# Patient Record
Sex: Female | Born: 1940
Health system: Southern US, Community
[De-identification: ages and names within clinical notes are randomized; demographics above are authoritative.]

## PROBLEM LIST (undated history)

## (undated) DIAGNOSIS — K219 Gastro-esophageal reflux disease without esophagitis: Secondary | ICD-10-CM

## (undated) DIAGNOSIS — I10 Essential (primary) hypertension: Secondary | ICD-10-CM

## (undated) DIAGNOSIS — M1990 Unspecified osteoarthritis, unspecified site: Secondary | ICD-10-CM

## (undated) DIAGNOSIS — Z85828 Personal history of other malignant neoplasm of skin: Secondary | ICD-10-CM

## (undated) DIAGNOSIS — G2581 Restless legs syndrome: Secondary | ICD-10-CM

## (undated) HISTORY — DX: Essential (primary) hypertension: I10

## (undated) HISTORY — DX: Gastro-esophageal reflux disease without esophagitis: K21.9

## (undated) HISTORY — PX: BREAST CYST EXCISION: SHX579

---

## 2000-02-15 HISTORY — PX: CHOLECYSTECTOMY: SHX55

## 2011-02-15 HISTORY — PX: DILATION AND CURETTAGE OF UTERUS: SHX78

## 2011-04-13 ENCOUNTER — Encounter: Payer: Self-pay | Admitting: Internal Medicine

## 2011-04-21 ENCOUNTER — Encounter: Payer: Self-pay | Admitting: Internal Medicine

## 2011-05-12 DIAGNOSIS — M25569 Pain in unspecified knee: Secondary | ICD-10-CM | POA: Diagnosis not present

## 2011-05-16 DIAGNOSIS — M25569 Pain in unspecified knee: Secondary | ICD-10-CM | POA: Diagnosis not present

## 2011-05-20 ENCOUNTER — Ambulatory Visit (AMBULATORY_SURGERY_CENTER): Payer: Medicare Other | Admitting: *Deleted

## 2011-05-20 VITALS — Ht 66.0 in | Wt 225.0 lb

## 2011-05-20 DIAGNOSIS — Z1211 Encounter for screening for malignant neoplasm of colon: Secondary | ICD-10-CM

## 2011-05-20 MED ORDER — PEG-KCL-NACL-NASULF-NA ASC-C 100 G PO SOLR: ORAL | Status: DC

## 2011-05-24 DIAGNOSIS — I10 Essential (primary) hypertension: Secondary | ICD-10-CM | POA: Diagnosis not present

## 2011-05-24 DIAGNOSIS — M25569 Pain in unspecified knee: Secondary | ICD-10-CM | POA: Diagnosis not present

## 2011-05-24 DIAGNOSIS — N390 Urinary tract infection, site not specified: Secondary | ICD-10-CM | POA: Diagnosis not present

## 2011-06-03 ENCOUNTER — Encounter: Payer: Self-pay | Admitting: Internal Medicine

## 2011-06-03 ENCOUNTER — Ambulatory Visit (AMBULATORY_SURGERY_CENTER): Payer: Medicare Other | Admitting: Internal Medicine

## 2011-06-03 VITALS — BP 143/74 | HR 68 | Temp 97.1°F | Resp 20 | Ht 66.0 in | Wt 225.0 lb

## 2011-06-03 DIAGNOSIS — D126 Benign neoplasm of colon, unspecified: Secondary | ICD-10-CM

## 2011-06-03 DIAGNOSIS — Z1211 Encounter for screening for malignant neoplasm of colon: Secondary | ICD-10-CM | POA: Diagnosis not present

## 2011-06-03 MED ORDER — SODIUM CHLORIDE 0.9 % IV SOLN: 500.0000 mL | INTRAVENOUS | Status: DC

## 2011-06-03 NOTE — Progress Notes (Signed)
Patient did not experience any of the following events: a burn prior to discharge; a fall within the facility; wrong site/side/patient/procedure/implant event; or a hospital transfer or hospital admission upon discharge from the facility. (G8907) Patient did not have preoperative order for IV antibiotic SSI prophylaxis. (G8918)  

## 2011-06-03 NOTE — Patient Instructions (Signed)

## 2011-06-03 NOTE — Op Note (Signed)
[facility] [address] [place], [place]  [address]  COLONOSCOPY PROCEDURE REPORT  PATIENT:  Latasha Williams, Latasha Williams  MR#:  [id number] BIRTHDATE:  Jan 24, 1941, 71 yrs. old  GENDER:  female ENDOSCOPIST:  Hedwig Morton. Juanda Chance, MD REF. BY:  Renae Fickle, M.D. PROCEDURE DATE:  06/03/2011 PROCEDURE:  Colonoscopy with biopsy ASA CLASS:  Class II INDICATIONS:  colorectal cancer screening, average risk last colon 2003 MEDICATIONS:   MAC sedation, administered by CRNA, propofol (Diprivan) 200 mg  DESCRIPTION OF PROCEDURE:   After the risks and benefits and of the procedure were explained, informed consent was obtained. Digital rectal exam was performed and revealed no rectal masses. The LB [id number] endoscope was introduced through the anus and advanced to the cecum, which was identified by both the appendix and ileocecal valve.  The quality of the prep was good, using MoviPrep.  The instrument was then slowly withdrawn as the colon was fully examined. <<PROCEDUREIMAGES>>  FINDINGS:  A diminutive polyp was found. 3 mm polyp at 60 cm The polyp was removed using cold biopsy forceps (see image1 and image2).  Moderate diverticulosis was found in the sigmoid to descending colon segments (see image6 and image7).  This was otherwise a normal examination of the colon (see image8, image5, image4, and image3).   Retroflexed views in the rectum revealed no abnormalities.    The scope was then withdrawn from the patient and the procedure completed.  COMPLICATIONS:  None ENDOSCOPIC IMPRESSION: 1) Diminutive polyp 2) Moderate diverticulosis in the sigmoid to descending colon segments 3) Otherwise normal examination RECOMMENDATIONS: 1) Await pathology results 2) High fiber diet.  REPEAT EXAM:  In 10 year(s) for.  recall depending on pt's health in 10 years  ______________________________ Hedwig Morton. Juanda Chance, MD  CC:  n. eSIGNED:   Hedwig Morton. Damyn Weitzel at 06/03/2011 11:41 AM  Ladora Daniel,  [phone]

## 2011-06-06 ENCOUNTER — Telehealth: Payer: Self-pay | Admitting: *Deleted

## 2011-06-06 NOTE — Telephone Encounter (Signed)
  Follow up Call-  Call back number 06/03/2011  Post procedure Call Back phone  # [phone]  Permission to leave phone message Yes     Patient questions:  Do you have a fever, pain , or abdominal swelling? no Pain Score  0 *  Have you tolerated food without any problems? yes  Have you been able to return to your normal activities? yes  Do you have any questions about your discharge instructions: Diet   no Medications  no Follow up visit  no  Do you have questions or concerns about your Care? no  Actions: * If pain score is 4 or above: No action needed, pain <4.

## 2011-06-07 ENCOUNTER — Encounter: Payer: Self-pay | Admitting: Internal Medicine

## 2011-06-09 DIAGNOSIS — N390 Urinary tract infection, site not specified: Secondary | ICD-10-CM | POA: Diagnosis not present

## 2011-06-09 DIAGNOSIS — N760 Acute vaginitis: Secondary | ICD-10-CM | POA: Diagnosis not present

## 2011-06-24 DIAGNOSIS — M1710 Unilateral primary osteoarthritis, unspecified knee: Secondary | ICD-10-CM | POA: Diagnosis not present

## 2011-07-01 DIAGNOSIS — M1710 Unilateral primary osteoarthritis, unspecified knee: Secondary | ICD-10-CM | POA: Diagnosis not present

## 2011-07-01 DIAGNOSIS — M25469 Effusion, unspecified knee: Secondary | ICD-10-CM | POA: Diagnosis not present

## 2011-07-01 DIAGNOSIS — M25569 Pain in unspecified knee: Secondary | ICD-10-CM | POA: Diagnosis not present

## 2011-07-01 DIAGNOSIS — IMO0002 Reserved for concepts with insufficient information to code with codable children: Secondary | ICD-10-CM | POA: Diagnosis not present

## 2011-07-08 DIAGNOSIS — M25569 Pain in unspecified knee: Secondary | ICD-10-CM | POA: Diagnosis not present

## 2011-07-08 DIAGNOSIS — M1710 Unilateral primary osteoarthritis, unspecified knee: Secondary | ICD-10-CM | POA: Diagnosis not present

## 2011-07-28 DIAGNOSIS — M1710 Unilateral primary osteoarthritis, unspecified knee: Secondary | ICD-10-CM | POA: Diagnosis not present

## 2011-08-24 DIAGNOSIS — M1710 Unilateral primary osteoarthritis, unspecified knee: Secondary | ICD-10-CM | POA: Diagnosis not present

## 2011-09-01 DIAGNOSIS — M1710 Unilateral primary osteoarthritis, unspecified knee: Secondary | ICD-10-CM | POA: Diagnosis not present

## 2011-09-07 DIAGNOSIS — M1710 Unilateral primary osteoarthritis, unspecified knee: Secondary | ICD-10-CM | POA: Diagnosis not present

## 2011-09-08 DIAGNOSIS — N760 Acute vaginitis: Secondary | ICD-10-CM | POA: Diagnosis not present

## 2011-09-08 DIAGNOSIS — N950 Postmenopausal bleeding: Secondary | ICD-10-CM | POA: Diagnosis not present

## 2011-09-08 DIAGNOSIS — R300 Dysuria: Secondary | ICD-10-CM | POA: Diagnosis not present

## 2011-10-03 DIAGNOSIS — N950 Postmenopausal bleeding: Secondary | ICD-10-CM | POA: Diagnosis not present

## 2011-10-20 DIAGNOSIS — R269 Unspecified abnormalities of gait and mobility: Secondary | ICD-10-CM | POA: Diagnosis not present

## 2011-10-20 DIAGNOSIS — Z6837 Body mass index (BMI) 37.0-37.9, adult: Secondary | ICD-10-CM | POA: Diagnosis not present

## 2011-10-20 DIAGNOSIS — M25569 Pain in unspecified knee: Secondary | ICD-10-CM | POA: Diagnosis not present

## 2011-10-20 DIAGNOSIS — M21069 Valgus deformity, not elsewhere classified, unspecified knee: Secondary | ICD-10-CM | POA: Diagnosis not present

## 2011-10-20 DIAGNOSIS — I10 Essential (primary) hypertension: Secondary | ICD-10-CM | POA: Diagnosis not present

## 2011-10-20 DIAGNOSIS — Z01818 Encounter for other preprocedural examination: Secondary | ICD-10-CM | POA: Diagnosis not present

## 2011-10-20 DIAGNOSIS — M1710 Unilateral primary osteoarthritis, unspecified knee: Secondary | ICD-10-CM | POA: Diagnosis not present

## 2011-10-26 DIAGNOSIS — M1710 Unilateral primary osteoarthritis, unspecified knee: Secondary | ICD-10-CM | POA: Diagnosis not present

## 2011-11-25 DIAGNOSIS — Z23 Encounter for immunization: Secondary | ICD-10-CM | POA: Diagnosis not present

## 2011-12-12 DIAGNOSIS — N950 Postmenopausal bleeding: Secondary | ICD-10-CM | POA: Diagnosis not present

## 2011-12-20 DIAGNOSIS — N950 Postmenopausal bleeding: Secondary | ICD-10-CM | POA: Diagnosis not present

## 2011-12-23 DIAGNOSIS — I10 Essential (primary) hypertension: Secondary | ICD-10-CM | POA: Diagnosis not present

## 2011-12-23 DIAGNOSIS — K219 Gastro-esophageal reflux disease without esophagitis: Secondary | ICD-10-CM | POA: Diagnosis not present

## 2011-12-23 DIAGNOSIS — N950 Postmenopausal bleeding: Secondary | ICD-10-CM | POA: Diagnosis not present

## 2011-12-23 DIAGNOSIS — E669 Obesity, unspecified: Secondary | ICD-10-CM | POA: Diagnosis not present

## 2011-12-23 DIAGNOSIS — I495 Sick sinus syndrome: Secondary | ICD-10-CM | POA: Diagnosis not present

## 2011-12-23 DIAGNOSIS — Z79899 Other long term (current) drug therapy: Secondary | ICD-10-CM | POA: Diagnosis not present

## 2011-12-23 DIAGNOSIS — M899 Disorder of bone, unspecified: Secondary | ICD-10-CM | POA: Diagnosis not present

## 2012-01-05 DIAGNOSIS — N950 Postmenopausal bleeding: Secondary | ICD-10-CM | POA: Diagnosis not present

## 2012-01-06 DIAGNOSIS — Z1231 Encounter for screening mammogram for malignant neoplasm of breast: Secondary | ICD-10-CM | POA: Diagnosis not present

## 2012-01-11 DIAGNOSIS — M1710 Unilateral primary osteoarthritis, unspecified knee: Secondary | ICD-10-CM | POA: Diagnosis not present

## 2012-01-11 DIAGNOSIS — M21069 Valgus deformity, not elsewhere classified, unspecified knee: Secondary | ICD-10-CM | POA: Diagnosis not present

## 2012-01-11 DIAGNOSIS — M25569 Pain in unspecified knee: Secondary | ICD-10-CM | POA: Diagnosis not present

## 2012-01-11 DIAGNOSIS — R269 Unspecified abnormalities of gait and mobility: Secondary | ICD-10-CM | POA: Diagnosis not present

## 2012-02-23 DIAGNOSIS — M1710 Unilateral primary osteoarthritis, unspecified knee: Secondary | ICD-10-CM | POA: Diagnosis not present

## 2012-03-15 DIAGNOSIS — J0100 Acute maxillary sinusitis, unspecified: Secondary | ICD-10-CM | POA: Diagnosis not present

## 2012-04-09 DIAGNOSIS — Z01818 Encounter for other preprocedural examination: Secondary | ICD-10-CM | POA: Diagnosis not present

## 2012-04-09 DIAGNOSIS — Z6833 Body mass index (BMI) 33.0-33.9, adult: Secondary | ICD-10-CM | POA: Diagnosis not present

## 2012-04-09 DIAGNOSIS — I10 Essential (primary) hypertension: Secondary | ICD-10-CM | POA: Diagnosis not present

## 2012-04-09 DIAGNOSIS — M25569 Pain in unspecified knee: Secondary | ICD-10-CM | POA: Diagnosis not present

## 2012-05-08 ENCOUNTER — Encounter (HOSPITAL_COMMUNITY): Payer: Self-pay | Admitting: Pharmacy Technician

## 2012-05-10 ENCOUNTER — Other Ambulatory Visit: Payer: Self-pay | Admitting: Orthopedic Surgery

## 2012-05-10 MED ORDER — DEXAMETHASONE SODIUM PHOSPHATE 10 MG/ML IJ SOLN: 10.0000 mg | Freq: Once | INTRAMUSCULAR | Status: DC

## 2012-05-10 MED ORDER — BUPIVACAINE LIPOSOME 1.3 % IJ SUSP: 20.0000 mL | Freq: Once | INTRAMUSCULAR | Status: DC

## 2012-05-10 NOTE — Progress Notes (Signed)
Preoperative surgical orders have been place into the Epic hospital system for Latasha Williams on 05/10/2012, 11:01 AM  by Patrica Duel for surgery on 05/21/2012.  Preop Total Knee orders including Experal, IV Tylenol, and IV Decadron as long as there are no contraindications to the above medications. Avel Peace, PA-C

## 2012-05-11 ENCOUNTER — Other Ambulatory Visit (HOSPITAL_COMMUNITY): Payer: Self-pay | Admitting: Orthopedic Surgery

## 2012-05-14 ENCOUNTER — Inpatient Hospital Stay (HOSPITAL_COMMUNITY): Admission: RE | Admit: 2012-05-14 | Payer: Medicare Other | Source: Ambulatory Visit

## 2012-05-14 ENCOUNTER — Ambulatory Visit (HOSPITAL_COMMUNITY)
Admission: RE | Admit: 2012-05-14 | Discharge: 2012-05-14 | Disposition: A | Discharge status: Home / Self Care | Payer: Medicare Other | Source: Ambulatory Visit | Attending: Orthopedic Surgery | Admitting: Orthopedic Surgery

## 2012-05-14 ENCOUNTER — Encounter (HOSPITAL_COMMUNITY)
Admission: RE | Admit: 2012-05-14 | Discharge: 2012-05-14 | Disposition: A | Discharge status: Home / Self Care | Payer: Medicare Other | Source: Ambulatory Visit | Attending: Orthopedic Surgery | Admitting: Orthopedic Surgery

## 2012-05-14 ENCOUNTER — Encounter (HOSPITAL_COMMUNITY): Payer: Self-pay

## 2012-05-14 DIAGNOSIS — Z01818 Encounter for other preprocedural examination: Secondary | ICD-10-CM | POA: Insufficient documentation

## 2012-05-14 DIAGNOSIS — I10 Essential (primary) hypertension: Secondary | ICD-10-CM | POA: Insufficient documentation

## 2012-05-14 DIAGNOSIS — R82998 Other abnormal findings in urine: Secondary | ICD-10-CM | POA: Diagnosis not present

## 2012-05-14 DIAGNOSIS — Z01812 Encounter for preprocedural laboratory examination: Secondary | ICD-10-CM | POA: Diagnosis not present

## 2012-05-14 DIAGNOSIS — M1710 Unilateral primary osteoarthritis, unspecified knee: Secondary | ICD-10-CM | POA: Insufficient documentation

## 2012-05-14 HISTORY — DX: Restless legs syndrome: G25.81

## 2012-05-14 HISTORY — DX: Unspecified osteoarthritis, unspecified site: M19.90

## 2012-05-14 HISTORY — DX: Personal history of other malignant neoplasm of skin: Z85.828

## 2012-05-14 LAB — CBC
HCT: 43.1 % (ref 36.0–46.0)
Hemoglobin: 14.7 g/dL (ref 12.0–15.0)
MCHC: 34.1 g/dL (ref 30.0–36.0)
RBC: 4.65 MIL/uL (ref 3.87–5.11)

## 2012-05-14 LAB — COMPREHENSIVE METABOLIC PANEL
ALT: 17 U/L (ref 0–35)
Alkaline Phosphatase: 92 U/L (ref 39–117)
BUN: 15 mg/dL (ref 6–23)
CO2: 30 mEq/L (ref 19–32)
Chloride: 101 mEq/L (ref 96–112)
GFR calc Af Amer: 77 mL/min — ABNORMAL LOW (ref >90)
GFR calc non Af Amer: 67 mL/min — ABNORMAL LOW (ref >90)
Glucose, Bld: 99 mg/dL (ref 70–99)
Potassium: 4.3 mEq/L (ref 3.5–5.1)
Sodium: 139 mEq/L (ref 135–145)
Total Bilirubin: 0.3 mg/dL (ref 0.3–1.2)

## 2012-05-14 LAB — URINALYSIS, ROUTINE W REFLEX MICROSCOPIC
Bilirubin Urine: NEGATIVE
Glucose, UA: NEGATIVE mg/dL
Hgb urine dipstick: NEGATIVE
Specific Gravity, Urine: 1.015 (ref 1.005–1.030)
Urobilinogen, UA: 0.2 mg/dL (ref 0.0–1.0)
pH: 7 (ref 5.0–8.0)

## 2012-05-14 LAB — APTT: aPTT: 31 seconds (ref 24–37)

## 2012-05-14 LAB — URINE MICROSCOPIC-ADD ON

## 2012-05-14 LAB — PROTIME-INR: Prothrombin Time: 13.1 seconds (ref 11.6–15.2)

## 2012-05-14 NOTE — Patient Instructions (Signed)
Latasha Williams  05/14/2012                           YOUR PROCEDURE IS SCHEDULED ON: 05/21/12               PLEASE REPORT TO SHORT STAY CENTER AT : 7:45 AM               CALL THIS NUMBER IF ANY PROBLEMS THE DAY OF SURGERY :               [phone]                      REMEMBER:   Do not eat food or drink liquids AFTER MIDNIGHT   Take these medicines the morning of surgery with A SIP OF WATER: PRILOSEC   Do not wear jewelry, make-up   Do not wear lotions, powders, or perfumes.   Do not shave legs or underarms 12 hrs. before surgery (men may shave face)  Do not bring valuables to the hospital.  Contacts, dentures or bridgework may not be worn into surgery.  Leave suitcase in the car. After surgery it may be brought to your room.  For patients admitted to the hospital more than one night, checkout time is 11:00                          The day of discharge.   Patients discharged the day of surgery will not be allowed to drive home                             If going home same day of surgery, must have someone stay with you first                           24 hrs at home and arrange for some one to drive you home from hospital.    Special Instructions:   Please read over the following fact sheets that you were given:               1. MRSA  INFORMATION                      2. [place] PREPARING FOR SURGERY SHEET               3. INCENTIVE SPIROMETER                                                X_____________________________________________________________________        Failure to follow these instructions may result in cancellation of your surgery

## 2012-05-14 NOTE — Progress Notes (Signed)
Abnormal CBC /UA faxed to Dr. Lequita Halt thru [facility]

## 2012-05-15 NOTE — Progress Notes (Signed)
Received fax from office - pt told to take Cipro BID x 3 days (she has at home)

## 2012-05-17 LAB — URINE CULTURE

## 2012-05-20 ENCOUNTER — Other Ambulatory Visit: Payer: Self-pay | Admitting: Orthopedic Surgery

## 2012-05-20 NOTE — H&P (Signed)
Latasha Williams  DOB: [birth year] Married / Language: English / Race: White Female  Date of Admission:  05/21/2012  Chief Complaint:  Right Knee Pain  History of Present Illness The patient is a 72 year old female who comes in for a preoperative History and Physical. The patient is scheduled for a right total knee arthroplasty to be performed by Dr. Gus Rankin. Aluisio, MD at [facility] on 05/21/2012. The patient is a 72 year old female who presents with knee complaints. The patient is seen for a second opinion. The patient reports right knee symptoms including: pain, swelling and grinding which began 6 month(s) ago without any known injury. Prior to being seen today the patient was previously evaluated by a colleague. Previous work-up for this problem has included knee x-rays and knee MRI. Past treatment for this problem has included intra-articular injection of corticosteroids (and viscosupplementation). The patient was last seen in clinic 6 week(s) ago. Note for "Knee pain": Her last cortisone injection was the week of Thanksgiving. She brought xrays with her on a disc. She has been treated by Dr. Magdalene Patricia. Her problems initially began last spring. She doesn't recall specific injury, but did remember that her knee pain developed after digging a hole for a tree in her back yard. She has developed a lot of swelling in the right knee. The swelling has been persistent. She has had a series of Cortisone and Visco supplements in the summer and early fall which didn't help. She had another Cortisone injection about 6 weeks ago and has had some benefit from that. Unfortunately her knee is getting progressive worse. Her pain is worse lateral than medial. She is starting to develop instability in the knee. She cannot trust the knee. She feels as though it is definitely limiting what she can and cannot do and she would like to be more active. She is ready to proceed with surgery. They  have been treated conservatively in the past for the above stated problem and despite conservative measures, they continue to have progressive pain and severe functional limitations and dysfunction. They have failed non-operative management including home exercise, medications, and injections. It is felt that they would benefit from undergoing total joint replacement. Risks and benefits of the procedure have been discussed with the patient and they elect to proceed with surgery. There are no active contraindications to surgery such as ongoing infection or rapidly progressive neurological disease.   Problem List Primary osteoarthritis of one knee (715.16)   Allergies No Known Drug Allergies. 02/23/2012   Family History Cancer. father and sister Congestive Heart Failure. mother Heart Disease. mother Heart disease in female family member before age 56 Osteoarthritis. mother   Social History Exercise. Exercises daily; does running / walking Illicit drug use. no Living situation. live with spouse Current work status. retired Financial planner (Currently). no Drug/Alcohol Rehab (Previously). no Marital status. married Tobacco use. former smoker; smoke(d) less than 1/2 pack(s) per day Number of flights of stairs before winded. 2-3 Pain Contract. no Tobacco / smoke exposure. no Alcohol use. never consumed alcohol Children. 2   Medication History Atenolol-Chlorthalidone (50-25MG  Tablet, Oral) Active. PriLOSEC ( Oral) Specific dose unknown - Active. Biotin 5000 ( Oral) Specific dose unknown - Active. Fish Oil Burp-Less ( Oral) Specific dose unknown - Active. Multivitamins ( Oral) Active.   Past Surgical History Breast Mass; Local Excision. bilateral Dilation and Curettage of Uterus Gallbladder Surgery. laporoscopic   Medical History High blood pressure Chronic Cystitis Vitamin d  deficiency (268.9) Insomnia Osteoporosis   Review of  Systems General:Not Present- Chills, Fever, Night Sweats, Fatigue, Weight Gain, Weight Loss and Memory Loss. Skin:Not Present- Hives, Itching, Rash, Eczema and Lesions. HEENT:Not Present- Tinnitus, Headache, Double Vision, Visual Loss, Hearing Loss and Dentures. Respiratory:Not Present- Shortness of breath with exertion, Shortness of breath at rest, Allergies, Coughing up blood and Chronic Cough. Cardiovascular:Not Present- Chest Pain, Racing/skipping heartbeats, Difficulty Breathing Lying Down, Murmur, Swelling and Palpitations. Gastrointestinal:Not Present- Bloody Stool, Heartburn, Abdominal Pain, Vomiting, Nausea, Constipation, Diarrhea, Difficulty Swallowing, Jaundice and Loss of appetitie. Female Genitourinary:Not Present- Blood in Urine, Urinary frequency, Weak urinary stream, Discharge, Flank Pain, Incontinence, Painful Urination, Urgency, Urinary Retention and Urinating at Night. Musculoskeletal:Present- Joint Swelling. Not Present- Muscle Weakness, Muscle Pain, Joint Pain, Back Pain, Morning Stiffness and Spasms. Neurological:Not Present- Tremor, Dizziness, Blackout spells, Paralysis, Difficulty with balance and Weakness. Psychiatric:Not Present- Insomnia.   Vitals Weight: 198 lb Height: 66 in Body Surface Area: 2.05 m Body Mass Index: 31.96 kg/m Pulse: 64 (Regular) Resp.: 16 (Unlabored) BP: 122/64 (Sitting, Left Arm, Standard)    Physical Exam The physical exam findings are as follows:   General Mental Status - Alert, cooperative and good historian. General Appearance- pleasant. Not in acute distress. Orientation- Oriented X3. Build & Nutrition- Well nourished and Well developed.   Head and Neck Head- normocephalic, atraumatic . Neck Global Assessment- supple. no bruit auscultated on the right and no bruit auscultated on the left.   Eye Pupil- Bilateral- Regular and Round. Motion- Bilateral- EOMI.   Chest and Lung  Exam Auscultation: Breath sounds:- clear at anterior chest wall and - clear at posterior chest wall. Adventitious sounds:- No Adventitious sounds.   Cardiovascular Auscultation:Rhythm- Regular rate and rhythm. Heart Sounds- S1 WNL and S2 WNL. Murmurs & Other Heart Sounds:Auscultation of the heart reveals - No Murmurs.   Abdomen Palpation/Percussion:Tenderness- Abdomen is non-tender to palpation. Rigidity (guarding)- Abdomen is soft. Auscultation:Auscultation of the abdomen reveals - Bowel sounds normal.   Female Genitourinary Not done, not pertinent to present illness  Musculoskeletal Well developed female, alert and oriented in no apparent distress. Her hips show normal range of motion with no discomfort. Her left knee shows no effusion. Her range of motion on the left is 0 to 135. She does not have any joint line tenderness or instability. Her right knee shows a large effusion. She has a valgus deformity in that right knee. She has range of motion of about 5 to 125. She is tender, lateral greater than medial. There is no instability noted. Pulse, sensation, and motor are intact.   RADIOGRAPHS: September xrays show she had gone to bone on bone in the lateral compartment. Her patellofemoral compartment was also arthritic, but not bone on bone.  Assessment & Plan(DREW L Kabria Hetzer, PA-C; 04/27/2012 9:46 AM) Primary osteoarthritis of one knee (715.16) Impression: Right Knee  Note: Plan is for a Right Total Knee Replacement by Dr. Lequita Halt.  Plan is to go to [facility].  Dr. Renae Fickle - Patient has been seen preoperatively and felt to be stable for surgery.  Signed electronically by Roberts Gaudy, PA-C

## 2012-05-21 ENCOUNTER — Encounter (HOSPITAL_COMMUNITY)
Admission: RE | Disposition: A | Discharge status: Skilled Nursing Facility | Payer: Self-pay | Source: Ambulatory Visit | Attending: Orthopedic Surgery

## 2012-05-21 ENCOUNTER — Encounter (HOSPITAL_COMMUNITY): Payer: Self-pay | Admitting: Anesthesiology

## 2012-05-21 ENCOUNTER — Inpatient Hospital Stay (HOSPITAL_COMMUNITY)
Admission: RE | Admit: 2012-05-21 | Discharge: 2012-05-24 | DRG: 470 | Disposition: A | Discharge status: Skilled Nursing Facility | Payer: Medicare Other | Source: Ambulatory Visit | Attending: Orthopedic Surgery | Admitting: Orthopedic Surgery

## 2012-05-21 ENCOUNTER — Inpatient Hospital Stay (HOSPITAL_COMMUNITY): Payer: Medicare Other | Admitting: Anesthesiology

## 2012-05-21 ENCOUNTER — Encounter (HOSPITAL_COMMUNITY): Payer: Self-pay | Admitting: *Deleted

## 2012-05-21 DIAGNOSIS — E876 Hypokalemia: Secondary | ICD-10-CM | POA: Diagnosis not present

## 2012-05-21 DIAGNOSIS — E871 Hypo-osmolality and hyponatremia: Secondary | ICD-10-CM | POA: Diagnosis not present

## 2012-05-21 DIAGNOSIS — D62 Acute posthemorrhagic anemia: Secondary | ICD-10-CM | POA: Diagnosis not present

## 2012-05-21 DIAGNOSIS — I10 Essential (primary) hypertension: Secondary | ICD-10-CM | POA: Diagnosis not present

## 2012-05-21 DIAGNOSIS — D500 Iron deficiency anemia secondary to blood loss (chronic): Secondary | ICD-10-CM | POA: Diagnosis not present

## 2012-05-21 DIAGNOSIS — IMO0002 Reserved for concepts with insufficient information to code with codable children: Secondary | ICD-10-CM | POA: Diagnosis not present

## 2012-05-21 DIAGNOSIS — M1710 Unilateral primary osteoarthritis, unspecified knee: Principal | ICD-10-CM | POA: Diagnosis present

## 2012-05-21 DIAGNOSIS — G2581 Restless legs syndrome: Secondary | ICD-10-CM | POA: Diagnosis present

## 2012-05-21 DIAGNOSIS — K219 Gastro-esophageal reflux disease without esophagitis: Secondary | ICD-10-CM | POA: Diagnosis not present

## 2012-05-21 DIAGNOSIS — M25569 Pain in unspecified knee: Secondary | ICD-10-CM | POA: Diagnosis not present

## 2012-05-21 DIAGNOSIS — M84469D Pathological fracture, unspecified tibia and fibula, subsequent encounter for fracture with routine healing: Secondary | ICD-10-CM | POA: Diagnosis not present

## 2012-05-21 DIAGNOSIS — Z85828 Personal history of other malignant neoplasm of skin: Secondary | ICD-10-CM | POA: Diagnosis not present

## 2012-05-21 DIAGNOSIS — D638 Anemia in other chronic diseases classified elsewhere: Secondary | ICD-10-CM | POA: Diagnosis not present

## 2012-05-21 DIAGNOSIS — M179 Osteoarthritis of knee, unspecified: Secondary | ICD-10-CM | POA: Diagnosis present

## 2012-05-21 DIAGNOSIS — G8918 Other acute postprocedural pain: Secondary | ICD-10-CM | POA: Diagnosis not present

## 2012-05-21 DIAGNOSIS — Z96659 Presence of unspecified artificial knee joint: Secondary | ICD-10-CM | POA: Diagnosis not present

## 2012-05-21 DIAGNOSIS — M129 Arthropathy, unspecified: Secondary | ICD-10-CM | POA: Diagnosis not present

## 2012-05-21 DIAGNOSIS — Z96651 Presence of right artificial knee joint: Secondary | ICD-10-CM

## 2012-05-21 HISTORY — PX: TOTAL KNEE ARTHROPLASTY: SHX125

## 2012-05-21 LAB — TYPE AND SCREEN: Antibody Screen: NEGATIVE

## 2012-05-21 SURGERY — ARTHROPLASTY, KNEE, TOTAL
Anesthesia: Spinal | Site: Knee | Laterality: Right | Wound class: Clean

## 2012-05-21 MED ORDER — METHOCARBAMOL 500 MG PO TABS
500.0000 mg | ORAL_TABLET | Freq: Four times a day (QID) | ORAL | Status: DC | PRN
  Administered 2012-05-21 – 2012-05-24 (×9): 500 mg via ORAL
  Filled 2012-05-21 (×9): qty 1

## 2012-05-21 MED ORDER — RIVAROXABAN 10 MG PO TABS
10.0000 mg | ORAL_TABLET | Freq: Every day | ORAL | Status: DC
  Administered 2012-05-22 – 2012-05-24 (×3): 10 mg via ORAL
  Filled 2012-05-21 (×4): qty 1

## 2012-05-21 MED ORDER — ACETAMINOPHEN 325 MG PO TABS: 650.0000 mg | ORAL_TABLET | Freq: Four times a day (QID) | ORAL | Status: DC | PRN

## 2012-05-21 MED ORDER — POLYETHYLENE GLYCOL 3350 17 G PO PACK
17.0000 g | PACK | Freq: Every day | ORAL | Status: DC | PRN
  Administered 2012-05-23: 17 g via ORAL

## 2012-05-21 MED ORDER — DOCUSATE SODIUM 100 MG PO CAPS
100.0000 mg | ORAL_CAPSULE | Freq: Two times a day (BID) | ORAL | Status: DC
  Administered 2012-05-21 – 2012-05-24 (×6): 100 mg via ORAL

## 2012-05-21 MED ORDER — METHOCARBAMOL 100 MG/ML IJ SOLN: 500.0000 mg | Freq: Four times a day (QID) | INTRAVENOUS | Status: DC | PRN

## 2012-05-21 MED ORDER — DEXTROSE-NACL 5-0.9 % IV SOLN
INTRAVENOUS | Status: DC
  Administered 2012-05-21 – 2012-05-22 (×2): via INTRAVENOUS

## 2012-05-21 MED ORDER — CHLORTHALIDONE 25 MG PO TABS
12.5000 mg | ORAL_TABLET | Freq: Every day | ORAL | Status: DC
  Administered 2012-05-22 – 2012-05-24 (×3): 12.5 mg via ORAL
  Filled 2012-05-21 (×4): qty 0.5

## 2012-05-21 MED ORDER — SODIUM CHLORIDE 0.9 % IR SOLN
Status: DC | PRN
  Administered 2012-05-21: 1000 mL

## 2012-05-21 MED ORDER — ONDANSETRON HCL 4 MG/2ML IJ SOLN
4.0000 mg | Freq: Four times a day (QID) | INTRAMUSCULAR | Status: DC | PRN
  Administered 2012-05-21: 4 mg via INTRAVENOUS
  Filled 2012-05-21: qty 2

## 2012-05-21 MED ORDER — ATENOLOL 25 MG PO TABS
25.0000 mg | ORAL_TABLET | Freq: Every day | ORAL | Status: DC
  Administered 2012-05-22 – 2012-05-24 (×3): 25 mg via ORAL
  Filled 2012-05-21 (×4): qty 1

## 2012-05-21 MED ORDER — PROPOFOL INFUSION 10 MG/ML OPTIME
INTRAVENOUS | Status: DC | PRN
  Administered 2012-05-21: 75 ug/kg/min via INTRAVENOUS

## 2012-05-21 MED ORDER — BUPIVACAINE LIPOSOME 1.3 % IJ SUSP
20.0000 mL | Freq: Once | INTRAMUSCULAR | Status: DC
  Filled 2012-05-21: qty 20

## 2012-05-21 MED ORDER — ACETAMINOPHEN 10 MG/ML IV SOLN
1000.0000 mg | Freq: Four times a day (QID) | INTRAVENOUS | Status: AC
  Administered 2012-05-21 – 2012-05-22 (×4): 1000 mg via INTRAVENOUS
  Filled 2012-05-21 (×6): qty 100

## 2012-05-21 MED ORDER — PHENYLEPHRINE HCL 10 MG/ML IJ SOLN
INTRAMUSCULAR | Status: DC | PRN
  Administered 2012-05-21 (×2): 80 ug via INTRAVENOUS

## 2012-05-21 MED ORDER — BUPIVACAINE IN DEXTROSE 0.75-8.25 % IT SOLN
INTRATHECAL | Status: DC | PRN
  Administered 2012-05-21: 1.6 mL via INTRATHECAL

## 2012-05-21 MED ORDER — 0.9 % SODIUM CHLORIDE (POUR BTL) OPTIME
TOPICAL | Status: DC | PRN
  Administered 2012-05-21: 1000 mL

## 2012-05-21 MED ORDER — PANTOPRAZOLE SODIUM 40 MG PO TBEC
40.0000 mg | DELAYED_RELEASE_TABLET | Freq: Every day | ORAL | Status: DC
  Administered 2012-05-22: 40 mg via ORAL
  Filled 2012-05-21 (×3): qty 1

## 2012-05-21 MED ORDER — DIPHENHYDRAMINE HCL 12.5 MG/5ML PO ELIX: 12.5000 mg | ORAL_SOLUTION | ORAL | Status: DC | PRN

## 2012-05-21 MED ORDER — TRAMADOL HCL 50 MG PO TABS
50.0000 mg | ORAL_TABLET | Freq: Four times a day (QID) | ORAL | Status: DC | PRN
  Administered 2012-05-22 – 2012-05-24 (×3): 100 mg via ORAL
  Filled 2012-05-21 (×3): qty 2

## 2012-05-21 MED ORDER — ONDANSETRON HCL 4 MG PO TABS: 4.0000 mg | ORAL_TABLET | Freq: Four times a day (QID) | ORAL | Status: DC | PRN

## 2012-05-21 MED ORDER — ATENOLOL-CHLORTHALIDONE 50-25 MG PO TABS: 0.5000 | ORAL_TABLET | Freq: Every day | ORAL | Status: DC

## 2012-05-21 MED ORDER — BISACODYL 10 MG RE SUPP: 10.0000 mg | Freq: Every day | RECTAL | Status: DC | PRN

## 2012-05-21 MED ORDER — MORPHINE SULFATE 2 MG/ML IJ SOLN
1.0000 mg | INTRAMUSCULAR | Status: DC | PRN
  Administered 2012-05-21: 1 mg via INTRAVENOUS
  Filled 2012-05-21: qty 1

## 2012-05-21 MED ORDER — FLEET ENEMA 7-19 GM/118ML RE ENEM: 1.0000 | ENEMA | Freq: Once | RECTAL | Status: AC | PRN

## 2012-05-21 MED ORDER — LACTATED RINGERS IV SOLN: INTRAVENOUS | Status: DC

## 2012-05-21 MED ORDER — CHLORHEXIDINE GLUCONATE 4 % EX LIQD
60.0000 mL | Freq: Once | CUTANEOUS | Status: DC
  Filled 2012-05-21: qty 60

## 2012-05-21 MED ORDER — PHENOL 1.4 % MT LIQD: 1.0000 | OROMUCOSAL | Status: DC | PRN

## 2012-05-21 MED ORDER — SODIUM CHLORIDE 0.9 % IJ SOLN
INTRAMUSCULAR | Status: DC | PRN
  Administered 2012-05-21: 12:00:00

## 2012-05-21 MED ORDER — ATENOLOL 25 MG PO TABS
25.0000 mg | ORAL_TABLET | Freq: Once | ORAL | Status: AC
  Administered 2012-05-21: 25 mg via ORAL
  Filled 2012-05-21: qty 1

## 2012-05-21 MED ORDER — DEXAMETHASONE SODIUM PHOSPHATE 10 MG/ML IJ SOLN: 10.0000 mg | Freq: Once | INTRAMUSCULAR | Status: AC

## 2012-05-21 MED ORDER — CEFAZOLIN SODIUM 1-5 GM-% IV SOLN
1.0000 g | Freq: Four times a day (QID) | INTRAVENOUS | Status: AC
  Administered 2012-05-21 (×2): 1 g via INTRAVENOUS
  Filled 2012-05-21 (×2): qty 50

## 2012-05-21 MED ORDER — ACETAMINOPHEN 10 MG/ML IV SOLN
1000.0000 mg | Freq: Once | INTRAVENOUS | Status: AC
  Administered 2012-05-21: 1000 mg via INTRAVENOUS

## 2012-05-21 MED ORDER — METOCLOPRAMIDE HCL 5 MG/ML IJ SOLN
5.0000 mg | Freq: Three times a day (TID) | INTRAMUSCULAR | Status: DC | PRN
  Administered 2012-05-21: 10 mg via INTRAVENOUS
  Filled 2012-05-21: qty 2

## 2012-05-21 MED ORDER — OXYCODONE HCL 5 MG PO TABS
5.0000 mg | ORAL_TABLET | ORAL | Status: DC | PRN
  Administered 2012-05-21 – 2012-05-22 (×7): 10 mg via ORAL
  Administered 2012-05-22: 5 mg via ORAL
  Administered 2012-05-22 – 2012-05-23 (×6): 10 mg via ORAL
  Filled 2012-05-21 (×9): qty 2
  Filled 2012-05-21: qty 1
  Filled 2012-05-21 (×4): qty 2

## 2012-05-21 MED ORDER — LACTATED RINGERS IV SOLN
INTRAVENOUS | Status: DC | PRN
  Administered 2012-05-21 (×3): via INTRAVENOUS

## 2012-05-21 MED ORDER — METOCLOPRAMIDE HCL 10 MG PO TABS: 5.0000 mg | ORAL_TABLET | Freq: Three times a day (TID) | ORAL | Status: DC | PRN

## 2012-05-21 MED ORDER — SODIUM CHLORIDE 0.9 % IV SOLN: INTRAVENOUS | Status: DC

## 2012-05-21 MED ORDER — CEFAZOLIN SODIUM-DEXTROSE 2-3 GM-% IV SOLR
2.0000 g | INTRAVENOUS | Status: AC
  Administered 2012-05-21: 2 g via INTRAVENOUS

## 2012-05-21 MED ORDER — FENTANYL CITRATE 0.05 MG/ML IJ SOLN: 25.0000 ug | INTRAMUSCULAR | Status: DC | PRN

## 2012-05-21 MED ORDER — DEXAMETHASONE 6 MG PO TABS
10.0000 mg | ORAL_TABLET | Freq: Once | ORAL | Status: AC
  Administered 2012-05-22: 10 mg via ORAL
  Filled 2012-05-21: qty 1

## 2012-05-21 MED ORDER — ACETAMINOPHEN 650 MG RE SUPP: 650.0000 mg | Freq: Four times a day (QID) | RECTAL | Status: DC | PRN

## 2012-05-21 MED ORDER — PROMETHAZINE HCL 25 MG/ML IJ SOLN
6.2500 mg | INTRAMUSCULAR | Status: DC | PRN
  Administered 2012-05-21: 6.25 mg via INTRAVENOUS

## 2012-05-21 MED ORDER — MENTHOL 3 MG MT LOZG
1.0000 | LOZENGE | OROMUCOSAL | Status: DC | PRN
  Filled 2012-05-21: qty 9

## 2012-05-21 MED ORDER — KETOROLAC TROMETHAMINE 30 MG/ML IJ SOLN: 15.0000 mg | Freq: Once | INTRAMUSCULAR | Status: DC | PRN

## 2012-05-21 SURGICAL SUPPLY — 57 items
BAG ZIPLOCK 12X15 (MISCELLANEOUS) ×2 IMPLANT
BANDAGE ELASTIC 6 VELCRO ST LF (GAUZE/BANDAGES/DRESSINGS) ×2 IMPLANT
BANDAGE ESMARK 6X9 LF (GAUZE/BANDAGES/DRESSINGS) ×1 IMPLANT
BLADE SAG 18X100X1.27 (BLADE) ×2 IMPLANT
BLADE SAW SGTL 11.0X1.19X90.0M (BLADE) ×2 IMPLANT
BNDG ESMARK 6X9 LF (GAUZE/BANDAGES/DRESSINGS) ×2
BOWL SMART MIX CTS (DISPOSABLE) ×2 IMPLANT
CEMENT HV SMART SET (Cement) ×4 IMPLANT
CLOTH BEACON ORANGE TIMEOUT ST (SAFETY) ×2 IMPLANT
CUFF TOURN SGL QUICK 34 (TOURNIQUET CUFF) ×1
CUFF TRNQT CYL 34X4X40X1 (TOURNIQUET CUFF) ×1 IMPLANT
DECANTER SPIKE VIAL GLASS SM (MISCELLANEOUS) ×4 IMPLANT
DRAPE EXTREMITY T 121X128X90 (DRAPE) ×2 IMPLANT
DRAPE POUCH INSTRU U-SHP 10X18 (DRAPES) ×2 IMPLANT
DRAPE U-SHAPE 47X51 STRL (DRAPES) ×2 IMPLANT
DRSG ADAPTIC 3X8 NADH LF (GAUZE/BANDAGES/DRESSINGS) ×2 IMPLANT
DRSG PAD ABDOMINAL 8X10 ST (GAUZE/BANDAGES/DRESSINGS) ×2 IMPLANT
DURAPREP 26ML APPLICATOR (WOUND CARE) ×4 IMPLANT
ELECT REM PT RETURN 9FT ADLT (ELECTROSURGICAL) ×2
ELECTRODE REM PT RTRN 9FT ADLT (ELECTROSURGICAL) ×1 IMPLANT
EVACUATOR 1/8 PVC DRAIN (DRAIN) ×2 IMPLANT
FACESHIELD LNG OPTICON STERILE (SAFETY) ×10 IMPLANT
GLOVE BIO SURGEON STRL SZ7.5 (GLOVE) ×4 IMPLANT
GLOVE BIO SURGEON STRL SZ8 (GLOVE) ×4 IMPLANT
GLOVE BIOGEL PI IND STRL 6.5 (GLOVE) ×4 IMPLANT
GLOVE BIOGEL PI IND STRL 8 (GLOVE) ×2 IMPLANT
GLOVE BIOGEL PI INDICATOR 6.5 (GLOVE) ×4
GLOVE BIOGEL PI INDICATOR 8 (GLOVE) ×2
GLOVE SURG SS PI 6.5 STRL IVOR (GLOVE) IMPLANT
GOWN STRL NON-REIN LRG LVL3 (GOWN DISPOSABLE) ×4 IMPLANT
GOWN STRL REIN XL XLG (GOWN DISPOSABLE) ×4 IMPLANT
HANDPIECE INTERPULSE COAX TIP (DISPOSABLE) ×1
IMMOBILIZER KNEE 20 (SOFTGOODS) ×2
IMMOBILIZER KNEE 20 THIGH 36 (SOFTGOODS) ×1 IMPLANT
KIT BASIN OR (CUSTOM PROCEDURE TRAY) ×2 IMPLANT
MANIFOLD NEPTUNE II (INSTRUMENTS) ×2 IMPLANT
NDL SAFETY ECLIPSE 18X1.5 (NEEDLE) ×1 IMPLANT
NEEDLE HYPO 18GX1.5 SHARP (NEEDLE) ×1
NEEDLE SPNL 20GX3.5 QUINCKE YW (NEEDLE) ×2 IMPLANT
NS IRRIG 1000ML POUR BTL (IV SOLUTION) ×2 IMPLANT
PACK TOTAL JOINT (CUSTOM PROCEDURE TRAY) ×2 IMPLANT
PAD ABD 7.5X8 STRL (GAUZE/BANDAGES/DRESSINGS) ×2 IMPLANT
PADDING CAST COTTON 6X4 STRL (CAST SUPPLIES) ×2 IMPLANT
POSITIONER SURGICAL ARM (MISCELLANEOUS) ×2 IMPLANT
SET HNDPC FAN SPRY TIP SCT (DISPOSABLE) ×1 IMPLANT
SPONGE GAUZE 4X4 12PLY (GAUZE/BANDAGES/DRESSINGS) ×2 IMPLANT
STRIP CLOSURE SKIN 1/2X4 (GAUZE/BANDAGES/DRESSINGS) ×4 IMPLANT
SUCTION FRAZIER 12FR DISP (SUCTIONS) ×2 IMPLANT
SUT MNCRL AB 4-0 PS2 18 (SUTURE) ×2 IMPLANT
SUT VIC AB 2-0 CT1 27 (SUTURE) ×3
SUT VIC AB 2-0 CT1 TAPERPNT 27 (SUTURE) ×3 IMPLANT
SUT VLOC 180 0 24IN GS25 (SUTURE) ×2 IMPLANT
SYR 50ML LL SCALE MARK (SYRINGE) ×2 IMPLANT
TOWEL OR 17X26 10 PK STRL BLUE (TOWEL DISPOSABLE) ×4 IMPLANT
TRAY FOLEY CATH 14FRSI W/METER (CATHETERS) ×2 IMPLANT
WATER STERILE IRR 1500ML POUR (IV SOLUTION) ×2 IMPLANT
WRAP KNEE MAXI GEL POST OP (GAUZE/BANDAGES/DRESSINGS) ×4 IMPLANT

## 2012-05-21 NOTE — Progress Notes (Signed)
PT Cancellation Note  Patient Details Name: Latasha Williams MRN: [id number] DOB: [birth year]   Cancelled Treatment:    Reason Eval/Treat Not Completed: Pt states she is not to get up today per her doctor, that she will only sit on EOB and prefers to do that later.  Will see tomorrow for PT eval.   Drucilla Chalet 05/21/2012, 5:16 PM

## 2012-05-21 NOTE — Anesthesia Procedure Notes (Signed)

## 2012-05-21 NOTE — H&P (View-Only) (Signed)
Latasha Williams  DOB: 01/20/1941 Married / Language: English / Race: White Female  Date of Admission:  05/21/2012  Chief Complaint:  Right Knee Pain  History of Present Illness The patient is a 72 year old female who comes in for a preoperative History and Physical. The patient is scheduled for a right total knee arthroplasty to be performed by Dr. Frank V. Aluisio, MD at [facility] on 05/21/2012. The patient is a 71 year old female who presents with knee complaints. The patient is seen for a second opinion. The patient reports right knee symptoms including: pain, swelling and grinding which began 6 month(s) ago without any known injury. Prior to being seen today the patient was previously evaluated by a colleague. Previous work-up for this problem has included knee x-rays and knee MRI. Past treatment for this problem has included intra-articular injection of corticosteroids (and viscosupplementation). The patient was last seen in clinic 6 week(s) ago. Note for "Knee pain": Her last cortisone injection was the week of Thanksgiving. She brought xrays with her on a disc. She has been treated by Dr. Megan Swanson. Her problems initially began last spring. She doesn't recall specific injury, but did remember that her knee pain developed after digging a hole for a tree in her back yard. She has developed a lot of swelling in the right knee. The swelling has been persistent. She has had a series of Cortisone and Visco supplements in the summer and early fall which didn't help. She had another Cortisone injection about 6 weeks ago and has had some benefit from that. Unfortunately her knee is getting progressive worse. Her pain is worse lateral than medial. She is starting to develop instability in the knee. She cannot trust the knee. She feels as though it is definitely limiting what she can and cannot do and she would like to be more active. She is ready to proceed with surgery. They  have been treated conservatively in the past for the above stated problem and despite conservative measures, they continue to have progressive pain and severe functional limitations and dysfunction. They have failed non-operative management including home exercise, medications, and injections. It is felt that they would benefit from undergoing total joint replacement. Risks and benefits of the procedure have been discussed with the patient and they elect to proceed with surgery. There are no active contraindications to surgery such as ongoing infection or rapidly progressive neurological disease.   Problem List Primary osteoarthritis of one knee (715.16)   Allergies No Known Drug Allergies. 02/23/2012   Family History Cancer. father and sister Congestive Heart Failure. mother Heart Disease. mother Heart disease in female family member before age 65 Osteoarthritis. mother   Social History Exercise. Exercises daily; does running / walking Illicit drug use. no Living situation. live with spouse Current work status. retired Drug/Alcohol Rehab (Currently). no Drug/Alcohol Rehab (Previously). no Marital status. married Tobacco use. former smoker; smoke(d) less than 1/2 pack(s) per day Number of flights of stairs before winded. 2-3 Pain Contract. no Tobacco / smoke exposure. no Alcohol use. never consumed alcohol Children. 2   Medication History Atenolol-Chlorthalidone (50-25MG Tablet, Oral) Active. PriLOSEC ( Oral) Specific dose unknown - Active. Biotin 5000 ( Oral) Specific dose unknown - Active. Fish Oil Burp-Less ( Oral) Specific dose unknown - Active. Multivitamins ( Oral) Active.   Past Surgical History Breast Mass; Local Excision. bilateral Dilation and Curettage of Uterus Gallbladder Surgery. laporoscopic   Medical History High blood pressure Chronic Cystitis Vitamin d   deficiency (268.9) Insomnia Osteoporosis   Review of  Systems General:Not Present- Chills, Fever, Night Sweats, Fatigue, Weight Gain, Weight Loss and Memory Loss. Skin:Not Present- Hives, Itching, Rash, Eczema and Lesions. HEENT:Not Present- Tinnitus, Headache, Double Vision, Visual Loss, Hearing Loss and Dentures. Respiratory:Not Present- Shortness of breath with exertion, Shortness of breath at rest, Allergies, Coughing up blood and Chronic Cough. Cardiovascular:Not Present- Chest Pain, Racing/skipping heartbeats, Difficulty Breathing Lying Down, Murmur, Swelling and Palpitations. Gastrointestinal:Not Present- Bloody Stool, Heartburn, Abdominal Pain, Vomiting, Nausea, Constipation, Diarrhea, Difficulty Swallowing, Jaundice and Loss of appetitie. Female Genitourinary:Not Present- Blood in Urine, Urinary frequency, Weak urinary stream, Discharge, Flank Pain, Incontinence, Painful Urination, Urgency, Urinary Retention and Urinating at Night. Musculoskeletal:Present- Joint Swelling. Not Present- Muscle Weakness, Muscle Pain, Joint Pain, Back Pain, Morning Stiffness and Spasms. Neurological:Not Present- Tremor, Dizziness, Blackout spells, Paralysis, Difficulty with balance and Weakness. Psychiatric:Not Present- Insomnia.   Vitals Weight: 198 lb Height: 66 in Body Surface Area: 2.05 m Body Mass Index: 31.96 kg/m Pulse: 64 (Regular) Resp.: 16 (Unlabored) BP: 122/64 (Sitting, Left Arm, Standard)    Physical Exam The physical exam findings are as follows:   General Mental Status - Alert, cooperative and good historian. General Appearance- pleasant. Not in acute distress. Orientation- Oriented X3. Build & Nutrition- Well nourished and Well developed.   Head and Neck Head- normocephalic, atraumatic . Neck Global Assessment- supple. no bruit auscultated on the right and no bruit auscultated on the left.   Eye Pupil- Bilateral- Regular and Round. Motion- Bilateral- EOMI.   Chest and Lung  Exam Auscultation: Breath sounds:- clear at anterior chest wall and - clear at posterior chest wall. Adventitious sounds:- No Adventitious sounds.   Cardiovascular Auscultation:Rhythm- Regular rate and rhythm. Heart Sounds- S1 WNL and S2 WNL. Murmurs & Other Heart Sounds:Auscultation of the heart reveals - No Murmurs.   Abdomen Palpation/Percussion:Tenderness- Abdomen is non-tender to palpation. Rigidity (guarding)- Abdomen is soft. Auscultation:Auscultation of the abdomen reveals - Bowel sounds normal.   Female Genitourinary Not done, not pertinent to present illness  Musculoskeletal Well developed female, alert and oriented in no apparent distress. Her hips show normal range of motion with no discomfort. Her left knee shows no effusion. Her range of motion on the left is 0 to 135. She does not have any joint line tenderness or instability. Her right knee shows a large effusion. She has a valgus deformity in that right knee. She has range of motion of about 5 to 125. She is tender, lateral greater than medial. There is no instability noted. Pulse, sensation, and motor are intact.   RADIOGRAPHS: September xrays show she had gone to bone on bone in the lateral compartment. Her patellofemoral compartment was also arthritic, but not bone on bone.  Assessment & Plan(DREW L PERKINS, PA-C; 04/27/2012 9:46 AM) Primary osteoarthritis of one knee (715.16) Impression: Right Knee  Note: Plan is for a Right Total Knee Replacement by Dr. Aluisio.  Plan is to go to [facility] [place].  Dr. John Gage - Patient has been seen preoperatively and felt to be stable for surgery.  Signed electronically by DREW L PERKINS, PA-C 

## 2012-05-21 NOTE — Anesthesia Preprocedure Evaluation (Signed)
Anesthesia Evaluation  Patient identified by MRN, date of birth, ID band Patient awake    Reviewed: Allergy & Precautions, H&P , NPO status , Patient's Chart, lab work & pertinent test results  Airway Mallampati: II TM Distance: >3 FB Neck ROM: Full    Dental no notable dental hx.    Pulmonary neg pulmonary ROS,  breath sounds clear to auscultation  Pulmonary exam normal       Cardiovascular hypertension, Pt. on medications Rhythm:Regular Rate:Normal     Neuro/Psych negative neurological ROS  negative psych ROS   GI/Hepatic negative GI ROS, Neg liver ROS,   Endo/Other  negative endocrine ROS  Renal/GU negative Renal ROS  negative genitourinary   Musculoskeletal negative musculoskeletal ROS (+)   Abdominal   Peds negative pediatric ROS (+)  Hematology negative hematology ROS (+)   Anesthesia Other Findings   Reproductive/Obstetrics negative OB ROS                           Anesthesia Physical Anesthesia Plan  ASA: II  Anesthesia Plan: Spinal   Post-op Pain Management:    Induction: Intravenous  Airway Management Planned: Simple Face Mask  Additional Equipment:   Intra-op Plan:   Post-operative Plan:   Informed Consent: I have reviewed the patients History and Physical, chart, labs and discussed the procedure including the risks, benefits and alternatives for the proposed anesthesia with the patient or authorized representative who has indicated his/her understanding and acceptance.     Plan Discussed with: CRNA and Surgeon  Anesthesia Plan Comments:         Anesthesia Quick Evaluation  

## 2012-05-21 NOTE — Op Note (Signed)
Pre-operative diagnosis- Osteoarthritis  Right knee(s)  Post-operative diagnosis- Osteoarthritis Right knee(s)  Procedure-  Right  Total Knee Arthroplasty  Surgeon- Gus Rankin. Esme Durkin, MD  Assistant- Avel Peace, PA-C   Anesthesia-  General EBL-* No blood loss amount entered *  Drains Hemovac  Tourniquet time-  Total Tourniquet Time Documented: Thigh (Right) - 32 minutes Total: Thigh (Right) - 32 minutes    Complications- None  Condition-PACU - hemodynamically stable.   Brief Clinical Note  Latasha Williams is a 72 y.o. year old female with end stage OA of her right knee with progressively worsening pain and dysfunction. She has constant pain, with activity and at rest and significant functional deficits with difficulties even with ADLs. She has had extensive non-op management including analgesics, injections of cortisone and viscosupplements, and home exercise program, but remains in significant pain with significant dysfunction.Radiographs show bone on bone arthritis lateral and patellofemoral. She presents now for right Total Knee Arthroplasty.    Procedure in detail---   The patient is brought into the operating room and positioned supine on the operating table. After successful administration of  General,   a tourniquet is placed high on the  Left thigh(s) and the lower extremity is prepped and draped in the usual sterile fashion. Time out is performed by the operating team and then the  Left lower extremity is wrapped in Esmarch, knee flexed and the tourniquet inflated to 300 mmHg.       A midline incision is made with a ten blade through the subcutaneous tissue to the level of the extensor mechanism. A fresh blade is used to make a medial parapatellar arthrotomy. Soft tissue over the proximal medial tibia is subperiosteally elevated to the joint line with a knife and into the semimembranosus bursa with a Cobb elevator. Soft tissue over the proximal lateral tibia is elevated with  attention being paid to avoiding the patellar tendon on the tibial tubercle. The patella is everted, knee flexed 90 degrees and the ACL and PCL are removed. Findings are bone on bone lateral and patellofemoral with large lateral osteophytes.        The drill is used to create a starting hole in the distal femur and the canal is thoroughly irrigated with sterile saline to remove the fatty contents. The 5 degree Left  valgus alignment guide is placed into the femoral canal and the distal femoral cutting block is pinned to remove 10 mm off the distal femur. Resection is made with an oscillating saw.      The tibia is subluxed forward and the menisci are removed. The extramedullary alignment guide is placed referencing proximally at the medial aspect of the tibial tubercle and distally along the second metatarsal axis and tibial crest. The block is pinned to remove 2mm off the more deficient lateral  side. Resection is made with an oscillating saw. Size 3is the most appropriate size for the tibia and the proximal tibia is prepared with the modular drill and keel punch for that size.      The femoral sizing guide is placed and size 3 is most appropriate. Rotation is marked off the epicondylar axis and confirmed by creating a rectangular flexion gap at 90 degrees. The size 3 cutting block is pinned in this rotation and the anterior, posterior and chamfer cuts are made with the oscillating saw. The intercondylar block is then placed and that cut is made.      Trial size 3 tibial component, trial size 3 posterior  stabilized femur and a 12.5  mm posterior stabilized rotating platform insert trial is placed. Full extension is achieved with excellent varus/valgus and anterior/posterior balance throughout full range of motion. The patella is everted and thickness measured to be 22  mm. Free hand resection is taken to 12 mm, a 38 template is placed, lug holes are drilled, trial patella is placed, and it tracks normally.  Osteophytes are removed off the posterior femur with the trial in place. All trials are removed and the cut bone surfaces prepared with pulsatile lavage. Cement is mixed and once ready for implantation, the size 3 tibial implant, size  3 posterior stabilized femoral component, and the size 38 patella are cemented in place and the patella is held with the clamp. The trial insert is placed and the knee held in full extension. The Exparel (20 ml mixed with 50 ml saline) is injected into the extensor mechanism, posterior capsule, medial and lateral gutters and subcutaneous tissues.  All extruded cement is removed and once the cement is hard the permanent 12.5 mm posterior stabilized rotating platform insert is placed into the tibial tray.      The wound is copiously irrigated with saline solution and the extensor mechanism closed over a hemovac drain with #1 PDS suture. The tourniquet is released for a total tourniquet time of 32  minutes. Flexion against gravity is 140 degrees and the patella tracks normally. Subcutaneous tissue is closed with 2.0 vicryl and subcuticular with running 4.0 Monocryl. The incision is cleaned and dried and steri-strips and a bulky sterile dressing are applied. The limb is placed into a knee immobilizer and the patient is awakened and transported to recovery in stable condition.      Please note that a surgical assistant was a medical necessity for this procedure in order to perform it in a safe and expeditious manner. Surgical assistant was necessary to retract the ligaments and vital neurovascular structures to prevent injury to them and also necessary for proper positioning of the limb to allow for anatomic placement of the prosthesis.   Gus Rankin Morio Widen, MD    05/21/2012, 11:38 AM

## 2012-05-21 NOTE — Progress Notes (Signed)
UR COMPLETED  

## 2012-05-21 NOTE — Interval H&P Note (Signed)
History and Physical Interval Note:  05/21/2012 9:34 AM  Latasha Williams  has presented today for surgery, with the diagnosis of OSTEOARTHRITIS RIGHT KNEE  The various methods of treatment have been discussed with the patient and family. After consideration of risks, benefits and other options for treatment, the patient has consented to  Procedure(s): TOTAL KNEE ARTHROPLASTY (Right) as a surgical intervention .  The patient's history has been reviewed, patient examined, no change in status, stable for surgery.  I have reviewed the patient's chart and labs.  Questions were answered to the patient's satisfaction.     Loanne Drilling

## 2012-05-21 NOTE — Transfer of Care (Signed)
Immediate Anesthesia Transfer of Care Note  Patient: Latasha Williams  Procedure(s) Performed: Procedure(s): TOTAL KNEE ARTHROPLASTY (Right)  Patient Location: PACU  Anesthesia Type:Spinal  Level of Consciousness: awake, alert , oriented and patient cooperative  Airway & Oxygen Therapy: Patient Spontanous Breathing and Patient connected to face mask oxygen  Post-op Assessment: Report given to PACU RN and Post -op Vital signs reviewed and stable  Post vital signs: Reviewed and stable  Complications: No apparent anesthesia complications

## 2012-05-21 NOTE — Anesthesia Postprocedure Evaluation (Signed)
  Anesthesia Post-op Note  Patient: Latasha Williams  Procedure(s) Performed: Procedure(s) (LRB): TOTAL KNEE ARTHROPLASTY (Right)  Patient Location: PACU  Anesthesia Type: Spinal  Level of Consciousness: awake and alert   Airway and Oxygen Therapy: Patient Spontanous Breathing  Post-op Pain: mild  Post-op Assessment: Post-op Vital signs reviewed, Patient's Cardiovascular Status Stable, Respiratory Function Stable, Patent Airway and No signs of Nausea or vomiting  Last Vitals:  Filed Vitals:   05/21/12 1230  BP: 121/63  Pulse: 56  Temp:   Resp: 17    Post-op Vital Signs: stable   Complications: No apparent anesthesia complications

## 2012-05-21 NOTE — Preoperative (Signed)
Beta Blockers   Reason not to administer Beta Blockers:Not Applicable 

## 2012-05-22 ENCOUNTER — Encounter (HOSPITAL_COMMUNITY): Payer: Self-pay | Admitting: Orthopedic Surgery

## 2012-05-22 LAB — CBC
HCT: 36 % (ref 36.0–46.0)
MCHC: 35 g/dL (ref 30.0–36.0)
Platelets: 176 10*3/uL (ref 150–400)
RDW: 13.4 % (ref 11.5–15.5)

## 2012-05-22 LAB — BASIC METABOLIC PANEL
BUN: 9 mg/dL (ref 6–23)
Creatinine, Ser: 0.87 mg/dL (ref 0.50–1.10)
GFR calc Af Amer: 74 mL/min — ABNORMAL LOW (ref >90)
GFR calc non Af Amer: 64 mL/min — ABNORMAL LOW (ref >90)
Potassium: 3.5 mEq/L (ref 3.5–5.1)

## 2012-05-22 MED ORDER — OMEPRAZOLE 20 MG PO CPDR
20.0000 mg | DELAYED_RELEASE_CAPSULE | Freq: Every day | ORAL | Status: DC
  Administered 2012-05-23 – 2012-05-24 (×2): 20 mg via ORAL
  Filled 2012-05-22 (×3): qty 1

## 2012-05-22 MED ORDER — NON FORMULARY: 20.0000 mg | Freq: Every day | Status: DC

## 2012-05-22 NOTE — Evaluation (Signed)
Physical Therapy Evaluation Patient Details Name: Latasha Williams MRN: [id number] DOB: January 01, 1941 Today's Date: 05/22/2012 Time: 4098-1191 PT Time Calculation (min): 18 min  PT Assessment / Plan / Recommendation Clinical Impression  Pt presents s/p R TKA POD 1 with decreased strength, ROM and mobility.  Tolerated OOB and ambulation in hallway very well at min assist.  Pt will benefit from skilled PT in acute venue to address deficits.  PT recommends ST SNF for follow up at D/C to maximize safety and function.     PT Assessment  Patient needs continued PT services    Follow Up Recommendations  SNF    Does the patient have the potential to tolerate intense rehabilitation      Barriers to Discharge        Equipment Recommendations  Rolling walker with 5" wheels    Recommendations for Other Services     Frequency 7X/week    Precautions / Restrictions Precautions Precautions: Knee Required Braces or Orthoses: Knee Immobilizer - Right Knee Immobilizer - Right: Discontinue once straight leg raise with < 10 degree lag Restrictions Weight Bearing Restrictions: No Other Position/Activity Restrictions: WBAT   Pertinent Vitals/Pain 5/10, ice packs applied      Mobility  Bed Mobility Bed Mobility: Supine to Sit Supine to Sit: 4: Min assist Details for Bed Mobility Assistance: Assist for RLE out of bed with cues for hand placement on bed to self assist.  Pt did very well with bed mobility.  Transfers Transfers: Sit to Stand;Stand to Sit Sit to Stand: 4: Min assist;From elevated surface;With upper extremity assist;From bed Stand to Sit: 4: Min assist;With armrests;With upper extremity assist;To chair/3-in-1 Details for Transfer Assistance: Cues for hand placement and LE management when sitting/standing.  Ambulation/Gait Ambulation/Gait Assistance: 4: Min assist Ambulation Distance (Feet): 68 Feet Assistive device: Rolling walker Ambulation/Gait Assistance Details: Cues for  sequencing/technique with RW.  Pt does very well maintaining upright posture without having to be cued.   Gait Pattern: Step-to pattern;Decreased stride length;Antalgic Gait velocity: decreased Stairs: No Wheelchair Mobility Wheelchair Mobility: No    Exercises     PT Diagnosis: Difficulty walking;Generalized weakness;Acute pain  PT Problem List: Decreased strength;Decreased range of motion;Decreased activity tolerance;Decreased balance;Decreased mobility;Decreased coordination;Decreased knowledge of use of DME;Decreased knowledge of precautions;Pain PT Treatment Interventions: DME instruction;Gait training;Functional mobility training;Therapeutic activities;Balance training;Therapeutic exercise;Patient/family education   PT Goals Acute Rehab PT Goals PT Goal Formulation: With patient Time For Goal Achievement: 05/25/12 Potential to Achieve Goals: Good Pt will go Supine/Side to Sit: with supervision PT Goal: Supine/Side to Sit - Progress: Goal set today Pt will go Sit to Supine/Side: with supervision PT Goal: Sit to Supine/Side - Progress: Goal set today Pt will go Sit to Stand: with supervision PT Goal: Sit to Stand - Progress: Goal set today Pt will Ambulate: 51 - 150 feet;with supervision;with least restrictive assistive device PT Goal: Ambulate - Progress: Goal set today Pt will Perform Home Exercise Program: with supervision, verbal cues required/provided PT Goal: Perform Home Exercise Program - Progress: Goal set today  Visit Information  Last PT Received On: 05/22/12 Assistance Needed: +1    Subjective Data  Subjective: This soreness will go away eventually right? Patient Stated Goal: to go to rehab then home.    Prior Functioning  Home Living Lives With: Spouse Available Help at Discharge: Family Home Adaptive Equipment: None Additional Comments: Pt is going to [facility] for rehab Prior Function Level of Independence: Independent Able to Take Stairs?: Yes Driving:  Yes Vocation: Retired  Communication Communication: No difficulties    Cognition  Cognition Overall Cognitive Status: Appears within functional limits for tasks assessed/performed Arousal/Alertness: Awake/alert Orientation Level: Appears intact for tasks assessed Behavior During Session: [facility] for tasks performed    Extremity/Trunk Assessment Right Lower Extremity Assessment RLE ROM/Strength/Tone: Deficits RLE ROM/Strength/Tone Deficits: ankle motions WFL, able to initiate SLR, however requires assist to lift and sustain.  RLE Sensation: WFL - Light Touch Left Lower Extremity Assessment LLE ROM/Strength/Tone: WFL for tasks assessed LLE Sensation: WFL - Light Touch Trunk Assessment Trunk Assessment: Normal   Balance    End of Session PT - End of Session Equipment Utilized During Treatment: Gait belt;Right knee immobilizer Activity Tolerance: Patient tolerated treatment well Patient left: in chair;with call bell/phone within reach Nurse Communication: Mobility status CPM Right Knee CPM Right Knee: Off  GP     Vista Deck 05/22/2012, 10:36 AM

## 2012-05-22 NOTE — Progress Notes (Signed)
   Subjective: 1 Day Post-Op Procedure(s) (LRB): TOTAL KNEE ARTHROPLASTY (Right) Patient reports pain as mild.   Patient seen in rounds with Dr. Lequita Halt. Patient is well, and has had no acute complaints or problems We will start therapy today.  Plan is to go Skilled nursing facility after hospital stay.  She wants to look into [facility] in [place].  Objective: Vital signs in last 24 hours: Temp:  [96.9 F (36.1 C)-99.1 F (37.3 C)] 98.7 F (37.1 C) (04/08 0555) Pulse Rate:  [42-84] 58 (04/08 0555) Resp:  [16-19] 16 (04/08 0555) BP: (100-132)/(48-72) 132/72 mmHg (04/08 0555) SpO2:  [90 %-100 %] 90 % (04/08 0555) Weight:  [91.627 kg (202 lb)] 91.627 kg (202 lb) (04/07 1426)  Intake/Output from previous day:  Intake/Output Summary (Last 24 hours) at 05/22/12 0932 Last data filed at 05/22/12 0600  Gross per 24 hour  Intake   4470 ml  Output   1645 ml  Net   2825 ml    Intake/Output this shift:    Labs:  Recent Labs  05/22/12 0452  HGB 12.6    Recent Labs  05/22/12 0452  WBC 11.2*  RBC 3.92  HCT 36.0  PLT 176    Recent Labs  05/22/12 0452  NA 135  K 3.5  CL 101  CO2 30  BUN 9  CREATININE 0.87  GLUCOSE 118*  CALCIUM 8.6   No results found for this basename: LABPT, INR,  in the last 72 hours  EXAM General - Patient is Alert, Appropriate and Oriented Extremity - Neurovascular intact Sensation intact distally Dorsiflexion/Plantar flexion intact Dressing - dressing C/D/I Motor Function - intact, moving foot and toes well on exam.  Hemovac pulled without difficulty.  Past Medical History  Diagnosis Date  . GERD (gastroesophageal reflux disease)   . Hypertension   . Arthritis   . History of skin cancer     BASAL CELL  . Restless leg syndrome     OCCASIONAL PROBLEM ONLY    Assessment/Plan: 1 Day Post-Op Procedure(s) (LRB): TOTAL KNEE ARTHROPLASTY (Right) Principal Problem:   OA (osteoarthritis) of knee  Estimated body mass index is 32.62  kg/(m^2) as calculated from the following:   Height as of this encounter: 5\' 6"  (1.676 m).   Weight as of this encounter: 91.627 kg (202 lb). Advance diet Up with therapy Discharge to SNF  DVT Prophylaxis - Xarelto Weight-Bearing as tolerated to right leg No vaccines. D/C O2 and Pulse OX and try on Room [address]  Patrica Duel 05/22/2012, 9:32 AM

## 2012-05-22 NOTE — Progress Notes (Signed)
Clinical Social Work Department CLINICAL SOCIAL WORK PLACEMENT NOTE 05/22/2012  Patient:  Latasha Williams, Latasha Williams  Account Number:  [id number] Admit date:  05/21/2012  Clinical Social Worker:  Cori Razor, LCSW  Date/time:  05/22/2012 10:09 AM  Clinical Social Work is seeking post-discharge placement for this patient at the following level of care:   SKILLED NURSING   (*CSW will update this form in Epic as items are completed)     Patient/family provided with [facility] Department of Clinical Social Work's list of facilities offering this level of care within the geographic area requested by the patient (or if unable, by the patient's family).  05/22/2012  Patient/family informed of their freedom to choose among providers that offer the needed level of care, that participate in Medicare, Medicaid or managed care program needed by the patient, have an available bed and are willing to accept the patient.    Patient/family informed of [facility]' ownership interest in [facility], as well as of the fact that they are under no obligation to receive care at this facility.  PASARR submitted to EDS on 05/22/2012 PASARR number received from EDS on 05/22/2012  FL2 transmitted to all facilities in geographic area requested by pt/family on  05/22/2012 FL2 transmitted to all facilities within larger geographic area on   Patient informed that his/her managed care company has contracts with or will negotiate with  certain facilities, including the following:     Patient/family informed of bed offers received:   Patient chooses bed at  Physician recommends and patient chooses bed at    Patient to be transferred to  on   Patient to be transferred to facility by   The following physician request were entered in Epic:   Additional Comments:  Cori Razor LCSW [phone]

## 2012-05-22 NOTE — Progress Notes (Signed)
Clinical Social Work Department BRIEF PSYCHOSOCIAL ASSESSMENT 05/22/2012  Patient:  Latasha Williams, Latasha Williams     Account Number:  [id number]     Admit date:  05/21/2012  Clinical Social Worker:  Candie Chroman  Date/Time:  05/22/2012 10:04 AM  Referred by:  Physician  Date Referred:  05/22/2012 Referred for  SNF Placement   Other Referral:   Interview type:  Patient Other interview type:    PSYCHOSOCIAL DATA Living Status:  ALONE Admitted from facility:   Level of care:   Primary support name:  Lossie Faes Primary support relationship to patient:  CHILD, ADULT Degree of support available:   supportive    CURRENT CONCERNS Current Concerns  Post-Acute Placement   Other Concerns:    SOCIAL WORK ASSESSMENT / PLAN Pt is a 72 yr old female living at home prior to hospitalization. CSW met with pt / family to assist with d/c planning. Pt hopes to have ST Rehab at [facility] following hospital d/c. CSW has contacted SNF and is waiting for confirmation. CSW will continue to follow to assist with d/c planning to SNF.   Assessment/plan status:  Psychosocial Support/Ongoing Assessment of Needs Other assessment/ plan:   Information/referral to community resources:   None needed at this time.    PATIENT'S/FAMILY'S RESPONSE TO PLAN OF CARE: Pt would like to have ST Rehab at [facility].   Cori Razor LCSW [phone]

## 2012-05-22 NOTE — Progress Notes (Signed)
Physical Therapy Treatment Patient Details Name: Latasha Williams MRN: [id number] DOB: 29-May-1940 Today's Date: 05/22/2012 Time: 4098-1191 PT Time Calculation (min): 20 min  PT Assessment / Plan / Recommendation Comments on Treatment Session  Pt progressing well with mobility, however with increased pain during knee flexion.     Follow Up Recommendations  SNF     Does the patient have the potential to tolerate intense rehabilitation     Barriers to Discharge        Equipment Recommendations  Rolling walker with 5" wheels    Recommendations for Other Services    Frequency 7X/week   Plan Discharge plan remains appropriate    Precautions / Restrictions Precautions Precautions: Knee Required Braces or Orthoses: Knee Immobilizer - Right Knee Immobilizer - Right: Discontinue once straight leg raise with < 10 degree lag Restrictions Weight Bearing Restrictions: No Other Position/Activity Restrictions: WBAT   Pertinent Vitals/Pain Max c/o pain with knee flex    Mobility  Bed Mobility Bed Mobility: Supine to Sit;Sit to Supine Supine to Sit: 4: Min assist Sit to Supine: 4: Min assist;HOB flat Details for Bed Mobility Assistance: Assist for RLE into and out of bed with min cues for hand placement to self assist and to adjust hips once in bed.  Transfers Transfers: Sit to Stand;Stand to Sit Sit to Stand: 4: Min assist;From elevated surface;With upper extremity assist;From bed Stand to Sit: 4: Min assist;With upper extremity assist;To bed;To elevated surface Details for Transfer Assistance: Cues for hand placement and LE management.  Ambulation/Gait Ambulation/Gait Assistance: 4: Min assist Ambulation Distance (Feet): 55 Feet Assistive device: Rolling walker Ambulation/Gait Assistance Details: Min cues for sequencing/technique and to increase UE WB to decrease antalgic gait pattern.  Gait Pattern: Step-to pattern;Decreased stride length;Antalgic Gait velocity: decreased     Exercises Total Joint Exercises Ankle Circles/Pumps: AROM;Both;20 reps Quad Sets: AROM;Right;10 reps Heel Slides: AAROM;Right;10 reps Hip ABduction/ADduction: AAROM;Right;10 reps Straight Leg Raises: AAROM;Right;10 reps   PT Diagnosis:    PT Problem List:   PT Treatment Interventions:     PT Goals Acute Rehab PT Goals PT Goal Formulation: With patient Time For Goal Achievement: 05/25/12 Potential to Achieve Goals: Good Pt will go Supine/Side to Sit: with supervision PT Goal: Supine/Side to Sit - Progress: Progressing toward goal Pt will go Sit to Supine/Side: with supervision PT Goal: Sit to Supine/Side - Progress: Progressing toward goal Pt will go Sit to Stand: with supervision PT Goal: Sit to Stand - Progress: Progressing toward goal Pt will Ambulate: 51 - 150 feet;with supervision;with least restrictive assistive device PT Goal: Ambulate - Progress: Progressing toward goal Pt will Perform Home Exercise Program: with supervision, verbal cues required/provided PT Goal: Perform Home Exercise Program - Progress: Progressing toward goal  Visit Information  Last PT Received On: 05/22/12 Assistance Needed: +1    Subjective Data  Subjective: It just hurts Patient Stated Goal: to go to rehab then home.    Cognition  Cognition Overall Cognitive Status: Appears within functional limits for tasks assessed/performed Arousal/Alertness: Awake/alert Orientation Level: Appears intact for tasks assessed Behavior During Session: [facility] for tasks performed    Balance     End of Session PT - End of Session Equipment Utilized During Treatment: Gait belt;Right knee immobilizer Activity Tolerance: Patient tolerated treatment well Patient left: with call bell/phone within reach;in bed;with family/visitor present Nurse Communication: Mobility status   GP     Vista Deck 05/22/2012, 4:25 PM

## 2012-05-22 NOTE — Progress Notes (Signed)
OT Screen Note  Patient Details Name: Latasha Williams MRN: [id number] DOB: Nov 18, 1940   Cancelled Treatment:    Reason Eval/Treat Not Completed: Other (comment).  Will defer OT eval to SNF  [facility] 05/22/2012, 11:54 AM Marica Otter, OTR/L [phone] 05/22/2012

## 2012-05-23 DIAGNOSIS — E876 Hypokalemia: Secondary | ICD-10-CM

## 2012-05-23 DIAGNOSIS — E871 Hypo-osmolality and hyponatremia: Secondary | ICD-10-CM

## 2012-05-23 LAB — CBC
HCT: 30.4 % — ABNORMAL LOW (ref 36.0–46.0)
MCH: 32 pg (ref 26.0–34.0)
MCV: 90.2 fL (ref 78.0–100.0)
Platelets: 173 10*3/uL (ref 150–400)
RBC: 3.37 MIL/uL — ABNORMAL LOW (ref 3.87–5.11)

## 2012-05-23 LAB — BASIC METABOLIC PANEL
BUN: 9 mg/dL (ref 6–23)
CO2: 30 mEq/L (ref 19–32)
Calcium: 8.9 mg/dL (ref 8.4–10.5)
Chloride: 98 mEq/L (ref 96–112)
Creatinine, Ser: 0.84 mg/dL (ref 0.50–1.10)

## 2012-05-23 MED ORDER — POTASSIUM CHLORIDE CRYS ER 20 MEQ PO TBCR
40.0000 meq | EXTENDED_RELEASE_TABLET | Freq: Two times a day (BID) | ORAL | Status: AC
  Administered 2012-05-23 (×2): 40 meq via ORAL
  Filled 2012-05-23 (×2): qty 2

## 2012-05-23 NOTE — Progress Notes (Signed)
Physical Therapy Treatment Patient Details Name: Latasha Williams MRN: [id number] DOB: [birth year] Today's Date: 05/23/2012 Time: 4098-1191 PT Time Calculation (min): 36 min  PT Assessment / Plan / Recommendation Comments on Treatment Session  Pt continues to progress well with all mobility.  Will be ready for D/C to SNF tomorrow.     Follow Up Recommendations  SNF     Does the patient have the potential to tolerate intense rehabilitation     Barriers to Discharge        Equipment Recommendations  Rolling walker with 5" wheels    Recommendations for Other Services    Frequency 7X/week   Plan Discharge plan remains appropriate    Precautions / Restrictions Precautions Precautions: Knee Required Braces or Orthoses: Knee Immobilizer - Right Knee Immobilizer - Right: Discontinue once straight leg raise with < 10 degree lag Restrictions Weight Bearing Restrictions: No Other Position/Activity Restrictions: WBAT   Pertinent Vitals/Pain 5/10 pain, ice packs applied    Mobility  Bed Mobility Bed Mobility: Supine to Sit;Sit to Supine Supine to Sit: 4: Min guard;4: Min assist;HOB flat Sit to Supine: 4: Min assist;HOB flat Details for Bed Mobility Assistance: Mostly guarding assist for RLE into and out of bed.  Pt doing well with UE placement to self assist.  Transfers Transfers: Sit to Stand;Stand to Sit Sit to Stand: 4: Min guard;With upper extremity assist;From bed Stand to Sit: 4: Min guard;With upper extremity assist;To bed Details for Transfer Assistance: Min cues for hand placement.  Ambulation/Gait Ambulation/Gait Assistance: 5: Supervision;4: Min guard Ambulation Distance (Feet): 200 Feet Assistive device: Rolling walker Ambulation/Gait Assistance Details: Min cues for slower gait speed and increasing UE WB to decrease antalgic gait pattern.  Gait Pattern: Step-to pattern;Decreased stride length;Antalgic Gait velocity: decreased    Exercises Total Joint Exercises Quad  Sets: AROM;Right;10 reps Heel Slides: AAROM;Right;10 reps Hip ABduction/ADduction: AAROM;Right;10 reps Straight Leg Raises: AAROM;Right;10 reps   PT Diagnosis:    PT Problem List:   PT Treatment Interventions:     PT Goals Acute Rehab PT Goals PT Goal Formulation: With patient Time For Goal Achievement: 05/25/12 Potential to Achieve Goals: Good Pt will go Supine/Side to Sit: with supervision PT Goal: Supine/Side to Sit - Progress: Progressing toward goal Pt will go Sit to Supine/Side: with supervision PT Goal: Sit to Supine/Side - Progress: Progressing toward goal Pt will go Sit to Stand: with supervision PT Goal: Sit to Stand - Progress: Progressing toward goal Pt will Ambulate: 51 - 150 feet;with supervision;with least restrictive assistive device PT Goal: Ambulate - Progress: Progressing toward goal Pt will Perform Home Exercise Program: with supervision, verbal cues required/provided PT Goal: Perform Home Exercise Program - Progress: Progressing toward goal  Visit Information  Last PT Received On: 05/23/12 Assistance Needed: +1    Subjective Data  Subjective: I hate the bending exercise.  Patient Stated Goal: to go to rehab then home.    Cognition  Cognition Overall Cognitive Status: Appears within functional limits for tasks assessed/performed Arousal/Alertness: Awake/alert Orientation Level: Appears intact for tasks assessed Behavior During Session: [facility] for tasks performed    Balance     End of Session PT - End of Session Equipment Utilized During Treatment: Right knee immobilizer Activity Tolerance: Patient tolerated treatment well Patient left: in bed;with call bell/phone within reach Nurse Communication: Mobility status CPM Right Knee CPM Right Knee: On   GP     Vista Deck 05/23/2012, 5:48 PM

## 2012-05-23 NOTE — Progress Notes (Signed)
   Subjective: 2 Days Post-Op Procedure(s) (LRB): TOTAL KNEE ARTHROPLASTY (Right) Patient reports pain as mild.   Patient seen in rounds for. Latasha Williams.  Family in room.  Doing better today. Patient is well, and has had no acute complaints or problems Plan is to go Skilled nursing facility after hospital stay.  Objective: Vital signs in last 24 hours: Temp:  [98.4 F (36.9 C)-99.7 F (37.6 C)] 98.7 F (37.1 C) (04/09 1400) Pulse Rate:  [62-71] 64 (04/09 1400) Resp:  [16] 16 (04/09 1400) BP: (106-136)/(61-84) 106/61 mmHg (04/09 1400) SpO2:  [94 %-99 %] 94 % (04/09 1400)  Intake/Output from previous day:  Intake/Output Summary (Last 24 hours) at 05/23/12 1439 Last data filed at 05/23/12 1322  Gross per 24 hour  Intake    480 ml  Output   2350 ml  Net  -1870 ml    Intake/Output this shift: Total I/O In: 240 [P.O.:240] Out: 950 [Urine:950]  Labs:  Recent Labs  05/22/12 0452 05/23/12 0425  HGB 12.6 10.8*    Recent Labs  05/22/12 0452 05/23/12 0425  WBC 11.2* 15.7*  RBC 3.92 3.37*  HCT 36.0 30.4*  PLT 176 173    Recent Labs  05/22/12 0452 05/23/12 0425  NA 135 134*  K 3.5 3.4*  CL 101 98  CO2 30 30  BUN 9 9  CREATININE 0.87 0.84  GLUCOSE 118* 130*  CALCIUM 8.6 8.9   No results found for this basename: LABPT, INR,  in the last 72 hours  EXAM General - Patient is Alert, Appropriate and Oriented Extremity - Neurovascular intact Sensation intact distally Dorsiflexion/Plantar flexion intact No cellulitis present Dressing/Incision - clean, dry, no drainage, healing Motor Function - intact, moving foot and toes well on exam.   Past Medical History  Diagnosis Date  . GERD (gastroesophageal reflux disease)   . Hypertension   . Arthritis   . History of skin cancer     BASAL CELL  . Restless leg syndrome     OCCASIONAL PROBLEM ONLY    Assessment/Plan: 2 Days Post-Op Procedure(s) (LRB): TOTAL KNEE ARTHROPLASTY (Right) Principal Problem:   OA  (osteoarthritis) of knee Active Problems:   Postop Hyponatremia   Postop Hypokalemia  Estimated body mass index is 32.62 kg/(m^2) as calculated from the following:   Height as of this encounter: 5\' 6"  (1.676 m).   Weight as of this encounter: 91.627 kg (202 lb). Plan for discharge tomorrow Discharge to SNF  DVT Prophylaxis - Xarelto Weight-Bearing as tolerated to right leg  [name] 05/23/2012, 2:39 PM

## 2012-05-23 NOTE — Progress Notes (Signed)
Physical Therapy Treatment Patient Details Name: Latasha Williams MRN: [id number] DOB: 1941-01-14 Today's Date: 05/23/2012 Time: 1478-2956 PT Time Calculation (min): 34 min  PT Assessment / Plan / Recommendation Comments on Treatment Session  Pt continues to progress well with all mobility.  Will be ready for D/C to SNF tomorrow.     Follow Up Recommendations  SNF     Does the patient have the potential to tolerate intense rehabilitation     Barriers to Discharge        Equipment Recommendations  Rolling walker with 5" wheels    Recommendations for Other Services    Frequency 7X/week   Plan Discharge plan remains appropriate    Precautions / Restrictions Precautions Precautions: Knee Required Braces or Orthoses: Knee Immobilizer - Right Knee Immobilizer - Right: Discontinue once straight leg raise with < 10 degree lag Restrictions Weight Bearing Restrictions: No Other Position/Activity Restrictions: WBAT   Pertinent Vitals/Pain 4/10 pain, ice packs applied    Mobility  Bed Mobility Bed Mobility: Supine to Sit Supine to Sit: 4: Min guard;4: Min assist;HOB flat Details for Bed Mobility Assistance: Mostly guarding assist for RLE out of bed.  Pt doing well with UE placement to self assist.  Transfers Transfers: Sit to Stand;Stand to Sit Sit to Stand: 4: Min guard;4: Min assist;From bed;With upper extremity assist Stand to Sit: 4: Min guard;With upper extremity assist;With armrests;To chair/3-in-1 Details for Transfer Assistance: Min cues for hand placement and LE management.  Ambulation/Gait Ambulation/Gait Assistance: 4: Min guard Ambulation Distance (Feet): 180 Feet Assistive device: Rolling walker Ambulation/Gait Assistance Details: Min cues for increasing UE WB to decrease antalgic gait pattern.   Gait Pattern: Step-to pattern;Decreased stride length;Antalgic Gait velocity: decreased    Exercises Total Joint Exercises Ankle Circles/Pumps: AROM;Both;20 reps Quad  Sets: AROM;Right;10 reps Heel Slides: AAROM;Right;10 reps Hip ABduction/ADduction: AAROM;Right;10 reps Straight Leg Raises: AAROM;Right;10 reps   PT Diagnosis:    PT Problem List:   PT Treatment Interventions:     PT Goals Acute Rehab PT Goals PT Goal Formulation: With patient Time For Goal Achievement: 05/25/12 Potential to Achieve Goals: Good Pt will go Supine/Side to Sit: with supervision PT Goal: Supine/Side to Sit - Progress: Progressing toward goal Pt will go Sit to Stand: with supervision PT Goal: Sit to Stand - Progress: Progressing toward goal Pt will Ambulate: 51 - 150 feet;with supervision;with least restrictive assistive device PT Goal: Ambulate - Progress: Progressing toward goal Pt will Perform Home Exercise Program: with supervision, verbal cues required/provided PT Goal: Perform Home Exercise Program - Progress: Progressing toward goal  Visit Information  Last PT Received On: 05/23/12 Assistance Needed: +1    Subjective Data  Subjective: It feels better once I get done walking and doing the exercises.  Patient Stated Goal: to go to rehab then home.    Cognition  Cognition Overall Cognitive Status: Appears within functional limits for tasks assessed/performed Arousal/Alertness: Awake/alert Orientation Level: Appears intact for tasks assessed Behavior During Session: [facility] for tasks performed    Balance     End of Session PT - End of Session Equipment Utilized During Treatment: Right knee immobilizer Activity Tolerance: Patient tolerated treatment well Patient left: with call bell/phone within reach;in chair Nurse Communication: Mobility status CPM Right Knee CPM Right Knee: Off   GP     Vista Deck 05/23/2012, 9:31 AM

## 2012-05-24 DIAGNOSIS — D500 Iron deficiency anemia secondary to blood loss (chronic): Secondary | ICD-10-CM | POA: Diagnosis not present

## 2012-05-24 DIAGNOSIS — M1710 Unilateral primary osteoarthritis, unspecified knee: Secondary | ICD-10-CM | POA: Diagnosis not present

## 2012-05-24 DIAGNOSIS — E876 Hypokalemia: Secondary | ICD-10-CM | POA: Diagnosis not present

## 2012-05-24 DIAGNOSIS — G2581 Restless legs syndrome: Secondary | ICD-10-CM | POA: Diagnosis not present

## 2012-05-24 DIAGNOSIS — G8918 Other acute postprocedural pain: Secondary | ICD-10-CM | POA: Diagnosis not present

## 2012-05-24 DIAGNOSIS — D62 Acute posthemorrhagic anemia: Secondary | ICD-10-CM

## 2012-05-24 DIAGNOSIS — Z96659 Presence of unspecified artificial knee joint: Secondary | ICD-10-CM | POA: Diagnosis not present

## 2012-05-24 DIAGNOSIS — I10 Essential (primary) hypertension: Secondary | ICD-10-CM | POA: Diagnosis not present

## 2012-05-24 DIAGNOSIS — D638 Anemia in other chronic diseases classified elsewhere: Secondary | ICD-10-CM | POA: Diagnosis not present

## 2012-05-24 DIAGNOSIS — Z85828 Personal history of other malignant neoplasm of skin: Secondary | ICD-10-CM | POA: Diagnosis not present

## 2012-05-24 DIAGNOSIS — M129 Arthropathy, unspecified: Secondary | ICD-10-CM | POA: Diagnosis not present

## 2012-05-24 DIAGNOSIS — M84469D Pathological fracture, unspecified tibia and fibula, subsequent encounter for fracture with routine healing: Secondary | ICD-10-CM | POA: Diagnosis not present

## 2012-05-24 DIAGNOSIS — E871 Hypo-osmolality and hyponatremia: Secondary | ICD-10-CM | POA: Diagnosis not present

## 2012-05-24 DIAGNOSIS — K219 Gastro-esophageal reflux disease without esophagitis: Secondary | ICD-10-CM | POA: Diagnosis not present

## 2012-05-24 LAB — BASIC METABOLIC PANEL
BUN: 13 mg/dL (ref 6–23)
Calcium: 8.6 mg/dL (ref 8.4–10.5)
Chloride: 98 mEq/L (ref 96–112)
Creatinine, Ser: 0.79 mg/dL (ref 0.50–1.10)
GFR calc Af Amer: >90 mL/min (ref >90)
GFR calc non Af Amer: 80 mL/min — ABNORMAL LOW (ref >90)

## 2012-05-24 LAB — CBC
HCT: 27.9 % — ABNORMAL LOW (ref 36.0–46.0)
MCH: 31.8 pg (ref 26.0–34.0)
MCHC: 35.1 g/dL (ref 30.0–36.0)
MCV: 90.6 fL (ref 78.0–100.0)
Platelets: 172 10*3/uL (ref 150–400)
RDW: 13.4 % (ref 11.5–15.5)
WBC: 10.9 10*3/uL — ABNORMAL HIGH (ref 4.0–10.5)

## 2012-05-24 MED ORDER — METHOCARBAMOL 500 MG PO TABS: 500.0000 mg | ORAL_TABLET | Freq: Four times a day (QID) | ORAL | Status: DC | PRN

## 2012-05-24 MED ORDER — DSS 100 MG PO CAPS: 100.0000 mg | ORAL_CAPSULE | Freq: Two times a day (BID) | ORAL | Status: DC

## 2012-05-24 MED ORDER — TRAMADOL HCL 50 MG PO TABS: 50.0000 mg | ORAL_TABLET | Freq: Four times a day (QID) | ORAL | Status: DC | PRN

## 2012-05-24 MED ORDER — DIPHENHYDRAMINE HCL 12.5 MG/5ML PO ELIX: 12.5000 mg | ORAL_SOLUTION | ORAL | Status: DC | PRN

## 2012-05-24 MED ORDER — POLYETHYLENE GLYCOL 3350 17 G PO PACK: 17.0000 g | PACK | Freq: Every day | ORAL | Status: DC | PRN

## 2012-05-24 MED ORDER — RIVAROXABAN 10 MG PO TABS: 10.0000 mg | ORAL_TABLET | Freq: Every day | ORAL | Status: DC

## 2012-05-24 MED ORDER — OXYCODONE HCL 5 MG PO TABS: 5.0000 mg | ORAL_TABLET | ORAL | Status: DC | PRN

## 2012-05-24 MED ORDER — ACETAMINOPHEN 325 MG PO TABS: 650.0000 mg | ORAL_TABLET | Freq: Four times a day (QID) | ORAL | Status: DC | PRN

## 2012-05-24 MED ORDER — BISACODYL 10 MG RE SUPP: 10.0000 mg | Freq: Every day | RECTAL | Status: DC | PRN

## 2012-05-24 MED ORDER — ONDANSETRON HCL 4 MG PO TABS: 4.0000 mg | ORAL_TABLET | Freq: Four times a day (QID) | ORAL | Status: AC | PRN

## 2012-05-24 NOTE — Progress Notes (Signed)
Physical Therapy Treatment Patient Details Name: NORBERTA STOBAUGH MRN: [id number] DOB: 18-Nov-1940 Today's Date: 05/24/2012 Time: 4098-1191 PT Time Calculation (min): 35 min  PT Assessment / Plan / Recommendation Comments on Treatment Session  Pt continues to progress well with all mobility.    Follow Up Recommendations  SNF     Does the patient have the potential to tolerate intense rehabilitation     Barriers to Discharge        Equipment Recommendations  Rolling walker with 5" wheels    Recommendations for Other Services    Frequency 7X/week   Plan Discharge plan remains appropriate    Precautions / Restrictions Precautions Precautions: Knee Required Braces or Orthoses: Knee Immobilizer - Right Knee Immobilizer - Right: Discontinue once straight leg raise with < 10 degree lag Restrictions Weight Bearing Restrictions: No Other Position/Activity Restrictions: WBAT   Pertinent Vitals/Pain Pt c/o general soreness in her R knee, tho only rated her pain during exercise, which was a 6/10. ICE applied   Mobility  Bed Mobility Bed Mobility: Supine to Sit Supine to Sit: 4: Min guard;4: Min assist Details for Bed Mobility Assistance: Mostly guarding assist for RLE into and out of bed.  Pt doing well with UE placement to self assist.  Transfers Transfers: Sit to Stand;Stand to Sit Sit to Stand: 4: Min guard;From bed Stand to Sit: 4: Min guard;To chair/3-in-1 Ambulation/Gait Ambulation/Gait Assistance: 5: Supervision Ambulation Distance (Feet): 300 Feet Assistive device: Rolling walker Gait Pattern: Step-through pattern;Within Functional Limits Gait velocity: WFL Stairs: No Wheelchair Mobility Wheelchair Mobility: No    Exercises Total Joint Exercises Ankle Circles/Pumps: AROM;Both;10 reps;Supine Quad Sets: AROM;Right;10 reps;Supine Towel Squeeze: AROM;Right;10 reps;Supine Short Arc Quad: AAROM;Right;10 reps;Supine Heel Slides: AAROM;Right;10 reps;Supine Hip  ABduction/ADduction: AROM;Right;10 reps;Supine Straight Leg Raises: AAROM;Right;10 reps;Supine     PT Goals Acute Rehab PT Goals PT Goal Formulation: With patient Time For Goal Achievement: 05/25/12 Potential to Achieve Goals: Good Pt will go Supine/Side to Sit: with supervision PT Goal: Supine/Side to Sit - Progress: Progressing toward goal Pt will go Sit to Stand: with supervision PT Goal: Sit to Stand - Progress: Progressing toward goal Pt will Ambulate: 51 - 150 feet;with supervision;with least restrictive assistive device PT Goal: Ambulate - Progress: Progressing toward goal Pt will Perform Home Exercise Program: with supervision, verbal cues required/provided PT Goal: Perform Home Exercise Program - Progress: Progressing toward goal  Visit Information  Last PT Received On: 05/24/12 Assistance Needed: +1    Cognition     Good   Balance     Good  End of Session PT - End of Session Equipment Utilized During Treatment: Right knee immobilizer Activity Tolerance: Patient tolerated treatment well Patient left: in chair;with call bell/phone within reach CPM Right Knee CPM Right Knee: Off   GP     BROWN-SMEDLEY, NICOLE 05/24/2012, 4:04 PM  Felecia Shelling  PTA [facility]  Acute  Rehab Pager      [phone]

## 2012-05-24 NOTE — Discharge Summary (Signed)
Physician Discharge Summary   Patient ID: LETICA GIAIMO MRN: [id number] DOB/AGE: [birth year] 72 y.o.  Admit date: 05/21/2012 Discharge date: 05/24/2012  Primary Diagnosis:  Osteoarthritis Right knee  Admission Diagnoses:  Past Medical History  Diagnosis Date  . GERD (gastroesophageal reflux disease)   . Hypertension   . Arthritis   . History of skin cancer     BASAL CELL  . Restless leg syndrome     OCCASIONAL PROBLEM ONLY   Discharge Diagnoses:   Principal Problem:   OA (osteoarthritis) of knee Active Problems:   Postop Hyponatremia   Postop Hypokalemia   Postoperative anemia due to acute blood loss  Estimated body mass index is 32.62 kg/(m^2) as calculated from the following:   Height as of this encounter: 5\' 6"  (1.676 m).   Weight as of this encounter: 91.627 kg (202 lb).  Procedure:  Procedure(s) (LRB): TOTAL KNEE ARTHROPLASTY (Right)   Consults: None  HPI: MARGEL JOENS is a 72 y.o. year old female with end stage OA of her right knee with progressively worsening pain and dysfunction. She has constant pain, with activity and at rest and significant functional deficits with difficulties even with ADLs. She has had extensive non-op management including analgesics, injections of cortisone and viscosupplements, and home exercise program, but remains in significant pain with significant dysfunction.Radiographs show bone on bone arthritis lateral and patellofemoral. She presents now for right Total Knee Arthroplasty.   Laboratory Data: Admission on 05/21/2012  Component Date Value Range Status  . WBC 05/22/2012 11.2* 4.0 - 10.5 K/uL Final  . RBC 05/22/2012 3.92  3.87 - 5.11 MIL/uL Final  . Hemoglobin 05/22/2012 12.6  12.0 - 15.0 g/dL Final  . HCT 40/98/1191 36.0  36.0 - 46.0 % Final  . MCV 05/22/2012 91.8  78.0 - 100.0 fL Final  . MCH 05/22/2012 32.1  26.0 - 34.0 pg Final  . MCHC 05/22/2012 35.0  30.0 - 36.0 g/dL Final  . RDW 47/82/9562 13.4  11.5 - 15.5 % Final  .  Platelets 05/22/2012 176  150 - 400 K/uL Final  . Sodium 05/22/2012 135  135 - 145 mEq/L Final  . Potassium 05/22/2012 3.5  3.5 - 5.1 mEq/L Final  . Chloride 05/22/2012 101  96 - 112 mEq/L Final  . CO2 05/22/2012 30  19 - 32 mEq/L Final  . Glucose, Bld 05/22/2012 118* 70 - 99 mg/dL Final  . BUN 13/09/6576 9  6 - 23 mg/dL Final  . Creatinine, Ser 05/22/2012 0.87  0.50 - 1.10 mg/dL Final  . Calcium 46/96/2952 8.6  8.4 - 10.5 mg/dL Final  . GFR calc non Af Amer 05/22/2012 64* >90 mL/min Final  . GFR calc Af Amer 05/22/2012 74* >90 mL/min Final   Comment:                                 The eGFR has been calculated                          using the CKD EPI equation.                          This calculation has not been                          validated in all clinical  situations.                          eGFR's persistently                          <90 mL/min signify                          possible Chronic Kidney Disease.  . WBC 05/23/2012 15.7* 4.0 - 10.5 K/uL Final  . RBC 05/23/2012 3.37* 3.87 - 5.11 MIL/uL Final  . Hemoglobin 05/23/2012 10.8* 12.0 - 15.0 g/dL Final  . HCT 16/11/9602 30.4* 36.0 - 46.0 % Final  . MCV 05/23/2012 90.2  78.0 - 100.0 fL Final  . MCH 05/23/2012 32.0  26.0 - 34.0 pg Final  . MCHC 05/23/2012 35.5  30.0 - 36.0 g/dL Final  . RDW 54/10/8117 13.1  11.5 - 15.5 % Final  . Platelets 05/23/2012 173  150 - 400 K/uL Final  . Sodium 05/23/2012 134* 135 - 145 mEq/L Final  . Potassium 05/23/2012 3.4* 3.5 - 5.1 mEq/L Final  . Chloride 05/23/2012 98  96 - 112 mEq/L Final  . CO2 05/23/2012 30  19 - 32 mEq/L Final  . Glucose, Bld 05/23/2012 130* 70 - 99 mg/dL Final  . BUN 14/78/2956 9  6 - 23 mg/dL Final  . Creatinine, Ser 05/23/2012 0.84  0.50 - 1.10 mg/dL Final  . Calcium 21/30/8657 8.9  8.4 - 10.5 mg/dL Final  . GFR calc non Af Amer 05/23/2012 67* >90 mL/min Final  . GFR calc Af Amer 05/23/2012 77* >90 mL/min Final   Comment:                                  The eGFR has been calculated                          using the CKD EPI equation.                          This calculation has not been                          validated in all clinical                          situations.                          eGFR's persistently                          <90 mL/min signify                          possible Chronic Kidney Disease.  . WBC 05/24/2012 10.9* 4.0 - 10.5 K/uL Final  . RBC 05/24/2012 3.08* 3.87 - 5.11 MIL/uL Final  . Hemoglobin 05/24/2012 9.8* 12.0 - 15.0 g/dL Final  . HCT 84/69/6295 27.9* 36.0 - 46.0 % Final  . MCV 05/24/2012 90.6  78.0 - 100.0 fL Final  . MCH 05/24/2012 31.8  26.0 - 34.0 pg Final  . MCHC 05/24/2012 35.1  30.0 -  36.0 g/dL Final  . RDW 16/11/9602 13.4  11.5 - 15.5 % Final  . Platelets 05/24/2012 172  150 - 400 K/uL Final  . Sodium 05/24/2012 134* 135 - 145 mEq/L Final  . Potassium 05/24/2012 4.1  3.5 - 5.1 mEq/L Final   Comment: DELTA CHECK NOTED                          REPEATED TO VERIFY  . Chloride 05/24/2012 98  96 - 112 mEq/L Final  . CO2 05/24/2012 32  19 - 32 mEq/L Final  . Glucose, Bld 05/24/2012 101* 70 - 99 mg/dL Final  . BUN 54/10/8117 13  6 - 23 mg/dL Final  . Creatinine, Ser 05/24/2012 0.79  0.50 - 1.10 mg/dL Final  . Calcium 14/78/2956 8.6  8.4 - 10.5 mg/dL Final  . GFR calc non Af Amer 05/24/2012 80* >90 mL/min Final  . GFR calc Af Amer 05/24/2012 >90  >90 mL/min Final   Comment:                                 The eGFR has been calculated                          using the CKD EPI equation.                          This calculation has not been                          validated in all clinical                          situations.                          eGFR's persistently                          <90 mL/min signify                          possible Chronic Kidney Disease.  Hospital Outpatient Visit on 05/14/2012  Component Date Value Range Status  . MRSA, PCR 05/14/2012  NEGATIVE  NEGATIVE Final  . Staphylococcus aureus 05/14/2012 NEGATIVE  NEGATIVE Final   Comment:                                 The Xpert SA Assay (FDA                          approved for NASAL specimens                          in patients over 68 years of age),                          is one component of                          a comprehensive surveillance  program.  Test performance has                          been validated by [facility] for patients greater                          than or equal to 57 year old.                          It is not intended                          to diagnose infection nor to                          guide or monitor treatment.  Marland Kitchen aPTT 05/14/2012 31  24 - 37 seconds Final  . WBC 05/14/2012 13.1* 4.0 - 10.5 K/uL Final  . RBC 05/14/2012 4.65  3.87 - 5.11 MIL/uL Final  . Hemoglobin 05/14/2012 14.7  12.0 - 15.0 g/dL Final  . HCT 16/11/9602 43.1  36.0 - 46.0 % Final  . MCV 05/14/2012 92.7  78.0 - 100.0 fL Final  . MCH 05/14/2012 31.6  26.0 - 34.0 pg Final  . MCHC 05/14/2012 34.1  30.0 - 36.0 g/dL Final  . RDW 54/10/8117 13.2  11.5 - 15.5 % Final  . Platelets 05/14/2012 240  150 - 400 K/uL Final  . Sodium 05/14/2012 139  135 - 145 mEq/L Final  . Potassium 05/14/2012 4.3  3.5 - 5.1 mEq/L Final  . Chloride 05/14/2012 101  96 - 112 mEq/L Final  . CO2 05/14/2012 30  19 - 32 mEq/L Final  . Glucose, Bld 05/14/2012 99  70 - 99 mg/dL Final  . BUN 14/78/2956 15  6 - 23 mg/dL Final  . Creatinine, Ser 05/14/2012 0.84  0.50 - 1.10 mg/dL Final  . Calcium 21/30/8657 9.5  8.4 - 10.5 mg/dL Final  . Total Protein 05/14/2012 6.6  6.0 - 8.3 g/dL Final  . Albumin 84/69/6295 3.5  3.5 - 5.2 g/dL Final  . AST 28/41/3244 20  0 - 37 U/L Final  . ALT 05/14/2012 17  0 - 35 U/L Final  . Alkaline Phosphatase 05/14/2012 92  39 - 117 U/L Final  . Total Bilirubin 05/14/2012 0.3  0.3 - 1.2 mg/dL Final  . GFR calc non Af Amer  05/14/2012 67* >90 mL/min Final  . GFR calc Af Amer 05/14/2012 77* >90 mL/min Final   Comment:                                 The eGFR has been calculated                          using the CKD EPI equation.                          This calculation has not been  validated in all clinical                          situations.                          eGFR's persistently                          <90 mL/min signify                          possible Chronic Kidney Disease.  Marland Kitchen Prothrombin Time 05/14/2012 13.1  11.6 - 15.2 seconds Final  . INR 05/14/2012 1.00  0.00 - 1.49 Final  . ABO/RH(D) 05/14/2012 O POS   Final  . Antibody Screen 05/14/2012 NEG   Final  . Sample Expiration 05/14/2012 05/24/2012   Final  . Color, Urine 05/14/2012 YELLOW  YELLOW Final  . APPearance 05/14/2012 CLEAR  CLEAR Final  . Specific Gravity, Urine 05/14/2012 1.015  1.005 - 1.030 Final  . pH 05/14/2012 7.0  5.0 - 8.0 Final  . Glucose, UA 05/14/2012 NEGATIVE  NEGATIVE mg/dL Final  . Hgb urine dipstick 05/14/2012 NEGATIVE  NEGATIVE Final  . Bilirubin Urine 05/14/2012 NEGATIVE  NEGATIVE Final  . Ketones, ur 05/14/2012 NEGATIVE  NEGATIVE mg/dL Final  . Protein, ur 16/11/9602 NEGATIVE  NEGATIVE mg/dL Final  . Urobilinogen, UA 05/14/2012 0.2  0.0 - 1.0 mg/dL Final  . Nitrite 54/10/8117 NEGATIVE  NEGATIVE Final  . Leukocytes, UA 05/14/2012 MODERATE* NEGATIVE Final  . Squamous Epithelial / LPF 05/14/2012 RARE  RARE Final  . WBC, UA 05/14/2012 11-20  <3 WBC/hpf Final  . Bacteria, UA 05/14/2012 FEW* RARE Final  . Specimen Description 05/14/2012 URINE, CLEAN CATCH   Final  . Special Requests 05/14/2012 NONE   Final  . Culture  Setup Time 05/14/2012 05/14/2012 21:26   Final  . Colony Count 05/14/2012 >=100,000 COLONIES/ML   Final  . Culture 05/14/2012 ESCHERICHIA COLI   Final  . Report Status 05/14/2012 05/17/2012 FINAL   Final  . Organism ID, Bacteria 05/14/2012 ESCHERICHIA COLI   Final  .  ABO/RH(D) 05/14/2012 O POS   Final     X-Rays:Dg Chest 2 View  05/14/2012  *RADIOLOGY REPORT*  Clinical Data: Preop knee arthroplasty  CHEST - 2 VIEW  Comparison: None  Findings: Lungs are clear without infiltrate or effusion.  Negative for heart failure or mass lesion.  Lung volume is normal.  IMPRESSION: No acute abnormality.   Original Report Authenticated By: Janeece Riggers, M.D.     EKG:No orders found for this or any previous visit.   Hospital Course: ARDETH REPETTO is a 72 y.o. who was admitted to [facility]. They were brought to the operating room on 05/21/2012 and underwent Procedure(s): TOTAL KNEE ARTHROPLASTY.  Patient tolerated the procedure well and was later transferred to the recovery room and then to the orthopaedic floor for postoperative care.  They were given PO and IV analgesics for pain control following their surgery.  They were given 24 hours of postoperative antibiotics of  Anti-infectives   Start     Dose/Rate Route Frequency Ordered Stop   05/21/12 1800  ceFAZolin (ANCEF) IVPB 1 g/50 mL premix     1 g 100 mL/hr over 30 Minutes Intravenous Every 6 hours 05/21/12 1325 05/21/12 2352   05/21/12 0740  ceFAZolin (ANCEF) IVPB 2  g/50 mL premix    Comments:  Dose changed to ancef 2 Gm based on weight < 120 kg ( weight documented to be 91 kg)   2 g 100 mL/hr over 30 Minutes Intravenous 60 min pre-op 05/21/12 0740 05/21/12 1045     and started on DVT prophylaxis in the form of Xarelto.   PT and OT were ordered for total joint protocol.  Discharge planning consulted to help with postop disposition and equipment needs. She wanted to look into [facility]. Patient had a decent night on the evening of surgery.  They started to get up OOB with therapy on day one. Hemovac drain was pulled without difficulty.  Continued to work with therapy into day two.  Dressing was changed on day two and the incision was healing well.  By day three, the patient had progressed with therapy  and meeting their goals.  Incision was healing well.  Patient was seen in rounds and was ready to go to [facility] in [place].   Discharge Medications: Prior to Admission medications   Medication Sig Start Date End Date Taking? Authorizing Provider  atenolol-chlorthalidone (TENORETIC) 50-25 MG per tablet Take 0.5 tablets by mouth daily before breakfast.    Yes Historical Provider, MD  chlorpheniramine (CHLOR-TRIMETON) 4 MG tablet Take 4 mg by mouth 2 (two) times daily as needed for allergies.   Yes Historical Provider, MD  omeprazole (PRILOSEC) 20 MG capsule Take 20 mg by mouth daily.   Yes Historical Provider, MD  acetaminophen (TYLENOL) 325 MG tablet Take 2 tablets (650 mg total) by mouth every 6 (six) hours as needed. 05/24/12   Siah Steely, PA-C  bisacodyl (DULCOLAX) 10 MG suppository Place 1 suppository (10 mg total) rectally daily as needed. 05/24/12   Monty Mccarrell Julien Girt, PA-C  diphenhydrAMINE (BENADRYL) 12.5 MG/5ML elixir Take 5-10 mLs (12.5-25 mg total) by mouth every 4 (four) hours as needed for itching. 05/24/12   Kendel Bessey, PA-C  docusate sodium 100 MG CAPS Take 100 mg by mouth 2 (two) times daily. 05/24/12   Laurian Edrington, PA-C  methocarbamol (ROBAXIN) 500 MG tablet Take 1 tablet (500 mg total) by mouth every 6 (six) hours as needed. 05/24/12   Aziel Morgan, PA-C  ondansetron (ZOFRAN) 4 MG tablet Take 1 tablet (4 mg total) by mouth every 6 (six) hours as needed for nausea. 05/24/12   Naidelyn Parrella, PA-C  oxyCODONE (OXY IR/ROXICODONE) 5 MG immediate release tablet Take 1-2 tablets (5-10 mg total) by mouth every 3 (three) hours as needed. 05/24/12   Shavanna Furnari, PA-C  polyethylene glycol (MIRALAX / GLYCOLAX) packet Take 17 g by mouth daily as needed. 05/24/12   Terrace Chiem Julien Girt, PA-C  rivaroxaban (XARELTO) 10 MG TABS tablet Take 1 tablet (10 mg total) by mouth daily with breakfast. Take Xarelto for two and a half more weeks, then discontinue  Xarelto. 05/24/12   Taunya Goral, PA-C  traMADol (ULTRAM) 50 MG tablet Take 1-2 tablets (50-100 mg total) by mouth every 6 (six) hours as needed (mild pain). 05/24/12   Shawnese Magner Julien Girt, PA-C    Diet: Cardiac diet Activity:WBAT Follow-up:in 2 weeks Disposition - Skilled nursing facility - [facility] in [place] Discharged Condition: good   Discharge Orders   Future Orders Complete By Expires     Call MD / Call 911  As directed     Comments:      If you experience chest pain or shortness of breath, CALL 911 and be transported to the hospital emergency room.  If you develope a fever above 101 F, pus (white drainage) or increased drainage or redness at the wound, or calf pain, call your surgeon's office.    Change dressing  As directed     Comments:      Change dressing daily with sterile 4 x 4 inch gauze dressing and apply TED hose. Do not submerge the incision under water.    Constipation Prevention  As directed     Comments:      Drink plenty of fluids.  Prune juice may be helpful.  You may use a stool softener, such as Colace (over the counter) 100 mg twice a day.  Use MiraLax (over the counter) for constipation as needed.    Diet - low sodium heart healthy  As directed     Discharge instructions  As directed     Comments:      Pick up stool softner and laxative for home. Do not submerge incision under water. May shower. Continue to use ice for pain and swelling from surgery.  Take Xarelto for two and a half more weeks, then discontinue Xarelto.  When discharged from the skilled rehab facility, please have the facility set up the patient's Home Health Physical Therapy prior to being released.  Also provide the patient with their medications at time of release from the facility to include their pain medication, the muscle relaxants, and their blood thinner medication.  If the patient is still at the rehab facility at time of follow up appointment, please also assist the  patient in arranging follow up appointment in our office and any transportation needs.    Do not put a pillow under the knee. Place it under the heel.  As directed     Do not sit on low chairs, stoools or toilet seats, as it may be difficult to get up from low surfaces  As directed     Driving restrictions  As directed     Comments:      No driving until released by the physician.    Increase activity slowly as tolerated  As directed     Lifting restrictions  As directed     Comments:      No lifting until released by the physician.    Patient may shower  As directed     Comments:      You may shower without a dressing once there is no drainage.  Do not wash over the wound.  If drainage remains, do not shower until drainage stops.    TED hose  As directed     Comments:      Use stockings (TED hose) for 3 weeks on both leg(s).  You may remove them at night for sleeping.    Weight bearing as tolerated  As directed         Medication List    STOP taking these medications       B-complex with vitamin C tablet     BIOTIN PO     ciprofloxacin 500 MG tablet  Commonly known as:  CIPRO     Fish Oil 1200 MG Caps     ibuprofen 200 MG tablet  Commonly known as:  ADVIL,MOTRIN     multivitamin with minerals tablet     naproxen sodium 220 MG tablet  Commonly known as:  ANAPROX      TAKE these medications       acetaminophen 325 MG tablet  Commonly known as:  TYLENOL  Take 2 tablets (650 mg total) by mouth every 6 (six) hours as needed.     atenolol-chlorthalidone 50-25 MG per tablet  Commonly known as:  TENORETIC  Take 0.5 tablets by mouth daily before breakfast.     bisacodyl 10 MG suppository  Commonly known as:  DULCOLAX  Place 1 suppository (10 mg total) rectally daily as needed.     chlorpheniramine 4 MG tablet  Commonly known as:  CHLOR-TRIMETON  Take 4 mg by mouth 2 (two) times daily as needed for allergies.     diphenhydrAMINE 12.5 MG/5ML elixir  Commonly known  as:  BENADRYL  Take 5-10 mLs (12.5-25 mg total) by mouth every 4 (four) hours as needed for itching.     DSS 100 MG Caps  Take 100 mg by mouth 2 (two) times daily.     methocarbamol 500 MG tablet  Commonly known as:  ROBAXIN  Take 1 tablet (500 mg total) by mouth every 6 (six) hours as needed.     omeprazole 20 MG capsule  Commonly known as:  PRILOSEC  Take 20 mg by mouth daily.     ondansetron 4 MG tablet  Commonly known as:  ZOFRAN  Take 1 tablet (4 mg total) by mouth every 6 (six) hours as needed for nausea.     oxyCODONE 5 MG immediate release tablet  Commonly known as:  Oxy IR/ROXICODONE  Take 1-2 tablets (5-10 mg total) by mouth every 3 (three) hours as needed.     polyethylene glycol packet  Commonly known as:  MIRALAX / GLYCOLAX  Take 17 g by mouth daily as needed.     rivaroxaban 10 MG Tabs tablet  Commonly known as:  XARELTO  Take 1 tablet (10 mg total) by mouth daily with breakfast. Take Xarelto for two and a half more weeks, then discontinue Xarelto.     traMADol 50 MG tablet  Commonly known as:  ULTRAM  Take 1-2 tablets (50-100 mg total) by mouth every 6 (six) hours as needed (mild pain).           Follow-up Information   Follow up with Loanne Drilling, MD. Schedule an appointment as soon as possible for a visit in 2 weeks.   Contact information:   [address], [address] [place] [address] [phone]       Signed: Patrica Duel 05/24/2012, 8:52 AM

## 2012-05-24 NOTE — Progress Notes (Signed)
   Subjective: 3 Days Post-Op Procedure(s) (LRB): TOTAL KNEE ARTHROPLASTY (Right) Patient reports pain as mild.   Patient seen in rounds with Dr. Lequita Halt.  Getting better each day. Patient is well, and has had no acute complaints or problems Patient is ready to go [facility].  Objective: Vital signs in last 24 hours: Temp:  [98.6 F (37 C)-99.2 F (37.3 C)] 98.6 F (37 C) (04/10 0548) Pulse Rate:  [64-76] 76 (04/10 0548) Resp:  [16] 16 (04/10 0548) BP: (106-129)/(61-69) 129/61 mmHg (04/10 0548) SpO2:  [94 %-99 %] 98 % (04/10 0548)  Intake/Output from previous day:  Intake/Output Summary (Last 24 hours) at 05/24/12 0718 Last data filed at 05/23/12 1825  Gross per 24 hour  Intake    240 ml  Output   1400 ml  Net  -1160 ml    Intake/Output this shift:    Labs:  Recent Labs  05/22/12 0452 05/23/12 0425 05/24/12 0432  HGB 12.6 10.8* 9.8*    Recent Labs  05/23/12 0425 05/24/12 0432  WBC 15.7* 10.9*  RBC 3.37* 3.08*  HCT 30.4* 27.9*  PLT 173 172    Recent Labs  05/23/12 0425 05/24/12 0432  NA 134* 134*  K 3.4* 4.1  CL 98 98  CO2 30 32  BUN 9 13  CREATININE 0.84 0.79  GLUCOSE 130* 101*  CALCIUM 8.9 8.6   No results found for this basename: LABPT, INR,  in the last 72 hours  EXAM: General - Patient is Alert, Appropriate and Oriented Extremity - Neurovascular intact Sensation intact distally Dorsiflexion/Plantar flexion intact No cellulitis present Incision - clean, dry, no drainage, healing Motor Function - intact, moving foot and toes well on exam.   Assessment/Plan: 3 Days Post-Op Procedure(s) (LRB): TOTAL KNEE ARTHROPLASTY (Right) Procedure(s) (LRB): TOTAL KNEE ARTHROPLASTY (Right) Past Medical History  Diagnosis Date  . GERD (gastroesophageal reflux disease)   . Hypertension   . Arthritis   . History of skin cancer     BASAL CELL  . Restless leg syndrome     OCCASIONAL PROBLEM ONLY   Principal Problem:   OA  (osteoarthritis) of knee Active Problems:   Postop Hyponatremia   Postop Hypokalemia  Estimated body mass index is 32.62 kg/(m^2) as calculated from the following:   Height as of this encounter: 5\' 6"  (1.676 m).   Weight as of this encounter: 91.627 kg (202 lb). Up with therapy Discharge to SNF Diet - Cardiac diet Follow up - in 2 weeks Activity - WBAT Disposition - Skilled nursing facility Condition Upon Discharge - Good D/C Meds - See DC Summary DVT Prophylaxis - Xarelto  Allyne Hebert 05/24/2012, 7:18 AM

## 2012-05-24 NOTE — Progress Notes (Signed)
Clinical Social Work Department CLINICAL SOCIAL WORK PLACEMENT NOTE 05/24/2012  Patient:  Latasha Williams, Latasha Williams  Account Number:  [id number] Admit date:  05/21/2012  Clinical Social Worker:  Cori Razor, LCSW  Date/time:  05/22/2012 10:09 AM  Clinical Social Work is seeking post-discharge placement for this patient at the following level of care:   SKILLED NURSING   (*CSW will update this form in Epic as items are completed)     Patient/family provided with [facility] Department of Clinical Social Work's list of facilities offering this level of care within the geographic area requested by the patient (or if unable, by the patient's family).  05/22/2012  Patient/family informed of their freedom to choose among providers that offer the needed level of care, that participate in Medicare, Medicaid or managed care program needed by the patient, have an available bed and are willing to accept the patient.    Patient/family informed of [facility]' ownership interest in [facility], as well as of the fact that they are under no obligation to receive care at this facility.  PASARR submitted to EDS on 05/22/2012 PASARR number received from EDS on 05/22/2012  FL2 transmitted to all facilities in geographic area requested by pt/family on  05/22/2012 FL2 transmitted to all facilities within larger geographic area on   Patient informed that his/her managed care company has contracts with or will negotiate with  certain facilities, including the following:     Patient/family informed of bed offers received:  05/23/2012 Patient chooses bed at [facility] Physician recommends and patient chooses bed at    Patient to be transferred to [facility]   on  05/24/2012 Patient to be transferred to facility by FAMILY  The following physician request were entered in Epic:   Additional Comments:  Cori Razor LCSW [phone]

## 2012-05-25 NOTE — Care Management Note (Signed)
    Page 1 of 1   05/25/2012     4:28:31 PM   CARE MANAGEMENT NOTE 05/25/2012  Patient:  NIANA, MARTORANA   Account Number:  [id number]  Date Initiated:  05/24/2012  Documentation initiated by:  Colleen Can  Subjective/Objective Assessment:   dx osteoarthritis right knee; total knee replacemnt     Action/Plan:   SNf rehab   Anticipated DC Date:     Anticipated DC Plan:  SKILLED NURSING FACILITY  In-house referral  Clinical Social Worker      DC Planning Services  CM consult      Choice offered to / List presented to:             Status of service:  Completed, signed off Medicare Important Message given?  NA - LOS <3 / Initial given by admissions (If response is "NO", the following Medicare IM given date fields will be blank) Date Medicare IM given:   Date Additional Medicare IM given:    Discharge Disposition:  SKILLED NURSING FACILITY  Per UR Regulation:    If discussed at Long Length of Stay Meetings, dates discussed:    Comments:

## 2012-06-03 DIAGNOSIS — Z471 Aftercare following joint replacement surgery: Secondary | ICD-10-CM | POA: Diagnosis not present

## 2012-06-03 DIAGNOSIS — I10 Essential (primary) hypertension: Secondary | ICD-10-CM | POA: Diagnosis not present

## 2012-06-03 DIAGNOSIS — IMO0001 Reserved for inherently not codable concepts without codable children: Secondary | ICD-10-CM | POA: Diagnosis not present

## 2012-06-03 DIAGNOSIS — Z96659 Presence of unspecified artificial knee joint: Secondary | ICD-10-CM | POA: Diagnosis not present

## 2012-06-04 DIAGNOSIS — Z96659 Presence of unspecified artificial knee joint: Secondary | ICD-10-CM | POA: Diagnosis not present

## 2012-06-04 DIAGNOSIS — Z471 Aftercare following joint replacement surgery: Secondary | ICD-10-CM | POA: Diagnosis not present

## 2012-06-04 DIAGNOSIS — IMO0001 Reserved for inherently not codable concepts without codable children: Secondary | ICD-10-CM | POA: Diagnosis not present

## 2012-06-04 DIAGNOSIS — I10 Essential (primary) hypertension: Secondary | ICD-10-CM | POA: Diagnosis not present

## 2012-06-05 DIAGNOSIS — Z96659 Presence of unspecified artificial knee joint: Secondary | ICD-10-CM | POA: Diagnosis not present

## 2012-06-05 DIAGNOSIS — I10 Essential (primary) hypertension: Secondary | ICD-10-CM | POA: Diagnosis not present

## 2012-06-05 DIAGNOSIS — Z471 Aftercare following joint replacement surgery: Secondary | ICD-10-CM | POA: Diagnosis not present

## 2012-06-05 DIAGNOSIS — IMO0001 Reserved for inherently not codable concepts without codable children: Secondary | ICD-10-CM | POA: Diagnosis not present

## 2012-06-06 DIAGNOSIS — Z471 Aftercare following joint replacement surgery: Secondary | ICD-10-CM | POA: Diagnosis not present

## 2012-06-06 DIAGNOSIS — Z96659 Presence of unspecified artificial knee joint: Secondary | ICD-10-CM | POA: Diagnosis not present

## 2012-06-06 DIAGNOSIS — I10 Essential (primary) hypertension: Secondary | ICD-10-CM | POA: Diagnosis not present

## 2012-06-06 DIAGNOSIS — IMO0001 Reserved for inherently not codable concepts without codable children: Secondary | ICD-10-CM | POA: Diagnosis not present

## 2012-06-07 DIAGNOSIS — I10 Essential (primary) hypertension: Secondary | ICD-10-CM | POA: Diagnosis not present

## 2012-06-07 DIAGNOSIS — IMO0001 Reserved for inherently not codable concepts without codable children: Secondary | ICD-10-CM | POA: Diagnosis not present

## 2012-06-07 DIAGNOSIS — Z96659 Presence of unspecified artificial knee joint: Secondary | ICD-10-CM | POA: Diagnosis not present

## 2012-06-07 DIAGNOSIS — Z471 Aftercare following joint replacement surgery: Secondary | ICD-10-CM | POA: Diagnosis not present

## 2012-06-08 DIAGNOSIS — Z471 Aftercare following joint replacement surgery: Secondary | ICD-10-CM | POA: Diagnosis not present

## 2012-06-08 DIAGNOSIS — IMO0001 Reserved for inherently not codable concepts without codable children: Secondary | ICD-10-CM | POA: Diagnosis not present

## 2012-06-08 DIAGNOSIS — Z96659 Presence of unspecified artificial knee joint: Secondary | ICD-10-CM | POA: Diagnosis not present

## 2012-06-08 DIAGNOSIS — I10 Essential (primary) hypertension: Secondary | ICD-10-CM | POA: Diagnosis not present

## 2012-06-11 DIAGNOSIS — Z471 Aftercare following joint replacement surgery: Secondary | ICD-10-CM | POA: Diagnosis not present

## 2012-06-11 DIAGNOSIS — IMO0001 Reserved for inherently not codable concepts without codable children: Secondary | ICD-10-CM | POA: Diagnosis not present

## 2012-06-11 DIAGNOSIS — Z96659 Presence of unspecified artificial knee joint: Secondary | ICD-10-CM | POA: Diagnosis not present

## 2012-06-11 DIAGNOSIS — I10 Essential (primary) hypertension: Secondary | ICD-10-CM | POA: Diagnosis not present

## 2012-06-12 DIAGNOSIS — I10 Essential (primary) hypertension: Secondary | ICD-10-CM | POA: Diagnosis not present

## 2012-06-12 DIAGNOSIS — Z471 Aftercare following joint replacement surgery: Secondary | ICD-10-CM | POA: Diagnosis not present

## 2012-06-12 DIAGNOSIS — Z96659 Presence of unspecified artificial knee joint: Secondary | ICD-10-CM | POA: Diagnosis not present

## 2012-06-12 DIAGNOSIS — IMO0001 Reserved for inherently not codable concepts without codable children: Secondary | ICD-10-CM | POA: Diagnosis not present

## 2012-06-13 DIAGNOSIS — Z471 Aftercare following joint replacement surgery: Secondary | ICD-10-CM | POA: Diagnosis not present

## 2012-06-13 DIAGNOSIS — IMO0001 Reserved for inherently not codable concepts without codable children: Secondary | ICD-10-CM | POA: Diagnosis not present

## 2012-06-13 DIAGNOSIS — I10 Essential (primary) hypertension: Secondary | ICD-10-CM | POA: Diagnosis not present

## 2012-06-13 DIAGNOSIS — Z96659 Presence of unspecified artificial knee joint: Secondary | ICD-10-CM | POA: Diagnosis not present

## 2012-06-14 DIAGNOSIS — I10 Essential (primary) hypertension: Secondary | ICD-10-CM | POA: Diagnosis not present

## 2012-06-14 DIAGNOSIS — Z471 Aftercare following joint replacement surgery: Secondary | ICD-10-CM | POA: Diagnosis not present

## 2012-06-14 DIAGNOSIS — Z96659 Presence of unspecified artificial knee joint: Secondary | ICD-10-CM | POA: Diagnosis not present

## 2012-06-14 DIAGNOSIS — IMO0001 Reserved for inherently not codable concepts without codable children: Secondary | ICD-10-CM | POA: Diagnosis not present

## 2012-06-15 DIAGNOSIS — Z471 Aftercare following joint replacement surgery: Secondary | ICD-10-CM | POA: Diagnosis not present

## 2012-06-15 DIAGNOSIS — I10 Essential (primary) hypertension: Secondary | ICD-10-CM | POA: Diagnosis not present

## 2012-06-15 DIAGNOSIS — Z96659 Presence of unspecified artificial knee joint: Secondary | ICD-10-CM | POA: Diagnosis not present

## 2012-06-15 DIAGNOSIS — IMO0001 Reserved for inherently not codable concepts without codable children: Secondary | ICD-10-CM | POA: Diagnosis not present

## 2012-06-18 DIAGNOSIS — I10 Essential (primary) hypertension: Secondary | ICD-10-CM | POA: Diagnosis not present

## 2012-06-18 DIAGNOSIS — R609 Edema, unspecified: Secondary | ICD-10-CM | POA: Diagnosis not present

## 2012-06-18 DIAGNOSIS — G4700 Insomnia, unspecified: Secondary | ICD-10-CM | POA: Diagnosis not present

## 2012-06-20 DIAGNOSIS — Z96659 Presence of unspecified artificial knee joint: Secondary | ICD-10-CM | POA: Diagnosis not present

## 2012-08-07 DIAGNOSIS — Z96659 Presence of unspecified artificial knee joint: Secondary | ICD-10-CM | POA: Diagnosis not present

## 2012-08-09 DIAGNOSIS — R609 Edema, unspecified: Secondary | ICD-10-CM | POA: Diagnosis not present

## 2012-08-09 DIAGNOSIS — N390 Urinary tract infection, site not specified: Secondary | ICD-10-CM | POA: Diagnosis not present

## 2012-08-09 DIAGNOSIS — I10 Essential (primary) hypertension: Secondary | ICD-10-CM | POA: Diagnosis not present

## 2012-11-12 DIAGNOSIS — Z23 Encounter for immunization: Secondary | ICD-10-CM | POA: Diagnosis not present

## 2012-11-12 DIAGNOSIS — K429 Umbilical hernia without obstruction or gangrene: Secondary | ICD-10-CM | POA: Diagnosis not present

## 2012-12-18 DIAGNOSIS — K420 Umbilical hernia with obstruction, without gangrene: Secondary | ICD-10-CM | POA: Diagnosis not present

## 2012-12-18 DIAGNOSIS — I10 Essential (primary) hypertension: Secondary | ICD-10-CM | POA: Diagnosis not present

## 2013-01-14 DIAGNOSIS — Z1231 Encounter for screening mammogram for malignant neoplasm of breast: Secondary | ICD-10-CM | POA: Diagnosis not present

## 2013-02-26 DIAGNOSIS — Z01419 Encounter for gynecological examination (general) (routine) without abnormal findings: Secondary | ICD-10-CM | POA: Diagnosis not present

## 2013-05-27 DIAGNOSIS — R609 Edema, unspecified: Secondary | ICD-10-CM | POA: Diagnosis not present

## 2013-05-27 DIAGNOSIS — R300 Dysuria: Secondary | ICD-10-CM | POA: Diagnosis not present

## 2013-05-27 DIAGNOSIS — I10 Essential (primary) hypertension: Secondary | ICD-10-CM | POA: Diagnosis not present

## 2013-05-27 DIAGNOSIS — R5381 Other malaise: Secondary | ICD-10-CM | POA: Diagnosis not present

## 2013-05-27 DIAGNOSIS — Z79899 Other long term (current) drug therapy: Secondary | ICD-10-CM | POA: Diagnosis not present

## 2013-05-27 DIAGNOSIS — E785 Hyperlipidemia, unspecified: Secondary | ICD-10-CM | POA: Diagnosis not present

## 2013-05-27 DIAGNOSIS — E559 Vitamin D deficiency, unspecified: Secondary | ICD-10-CM | POA: Diagnosis not present

## 2013-08-13 DIAGNOSIS — Z471 Aftercare following joint replacement surgery: Secondary | ICD-10-CM | POA: Diagnosis not present

## 2013-09-16 IMAGING — CR DG CHEST 2V
2 series · 2 of 2 positions shown · non-contrast
Comparison: None

CLINICAL DATA: Preop knee arthroplasty

CHEST - 2 VIEW

[w chest pa]
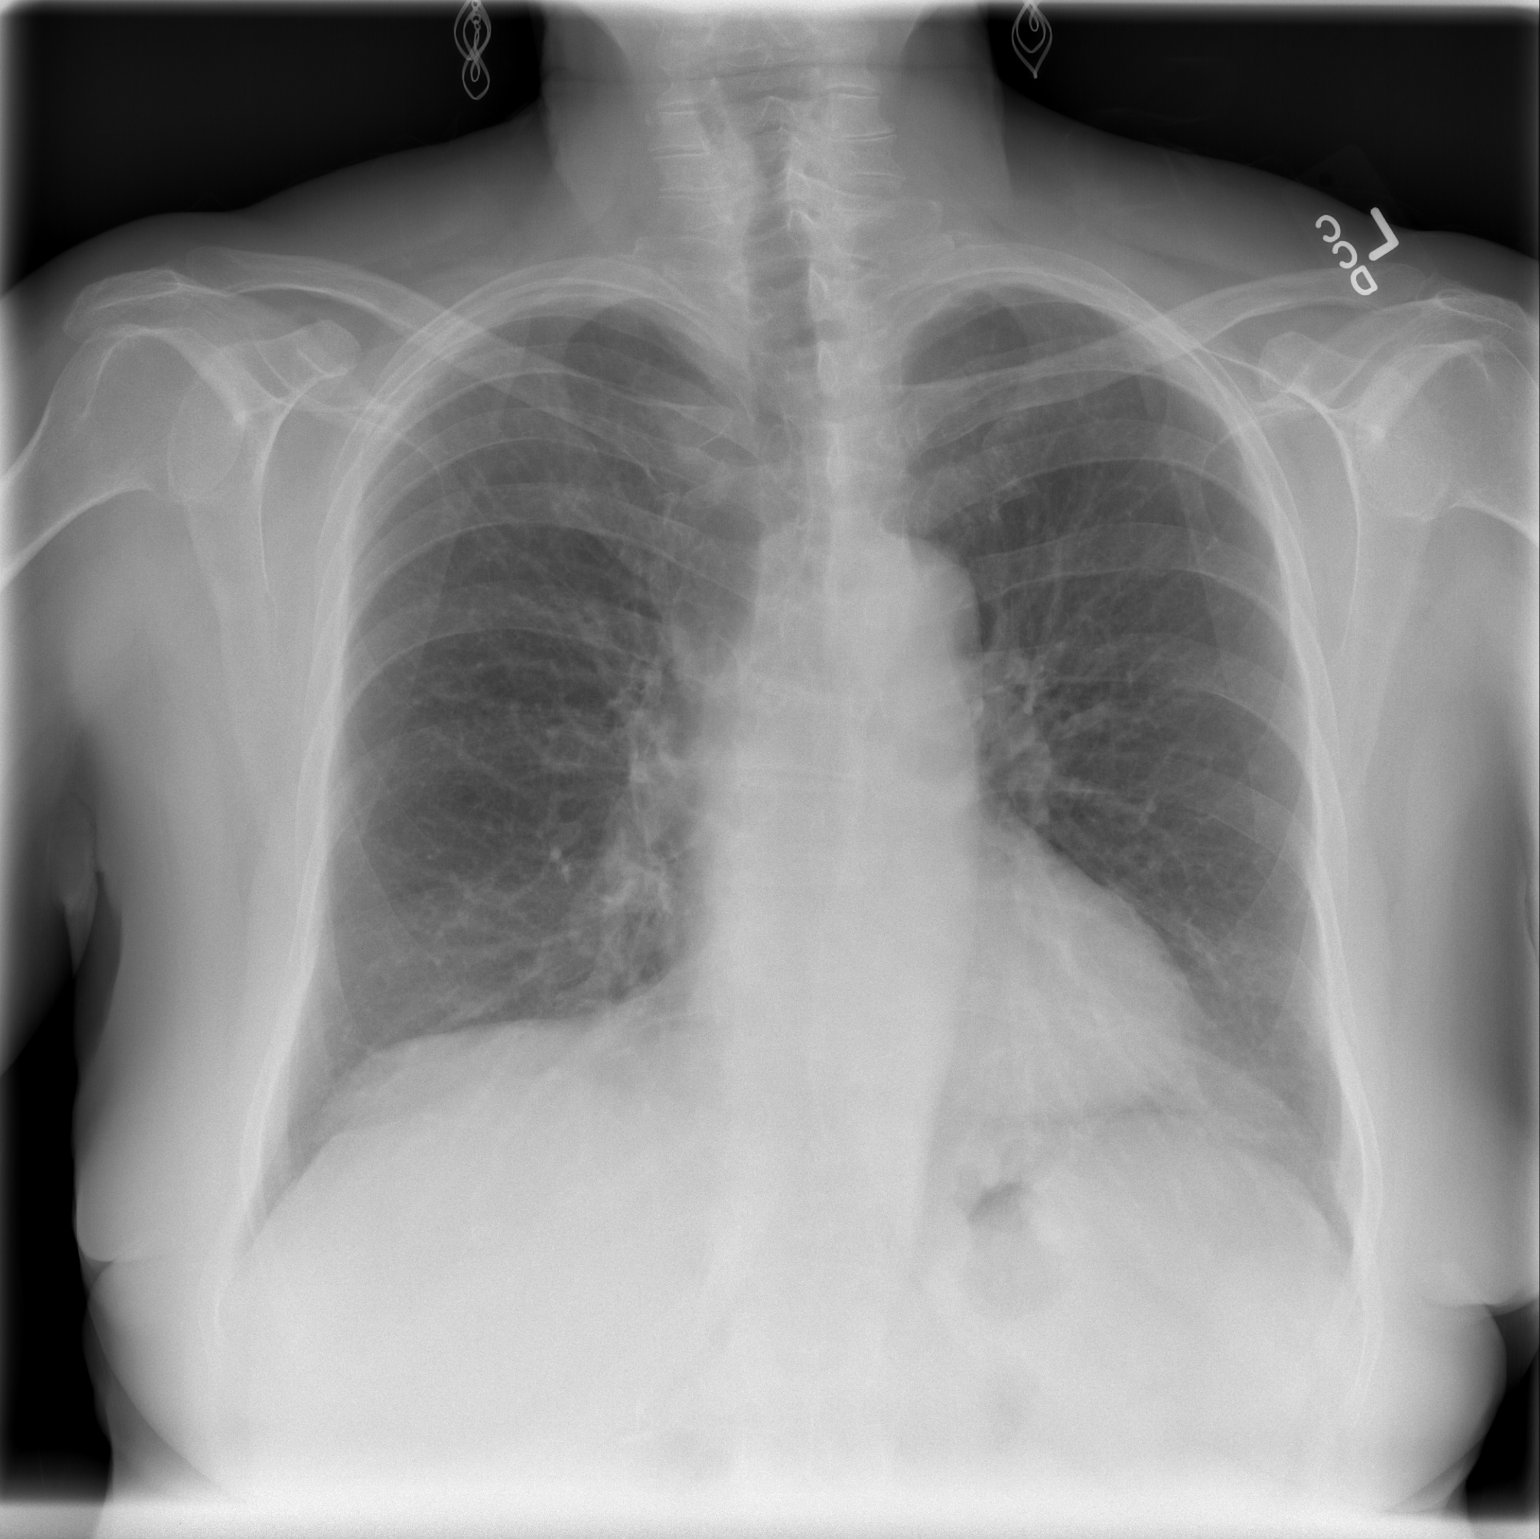

[w chest lat]
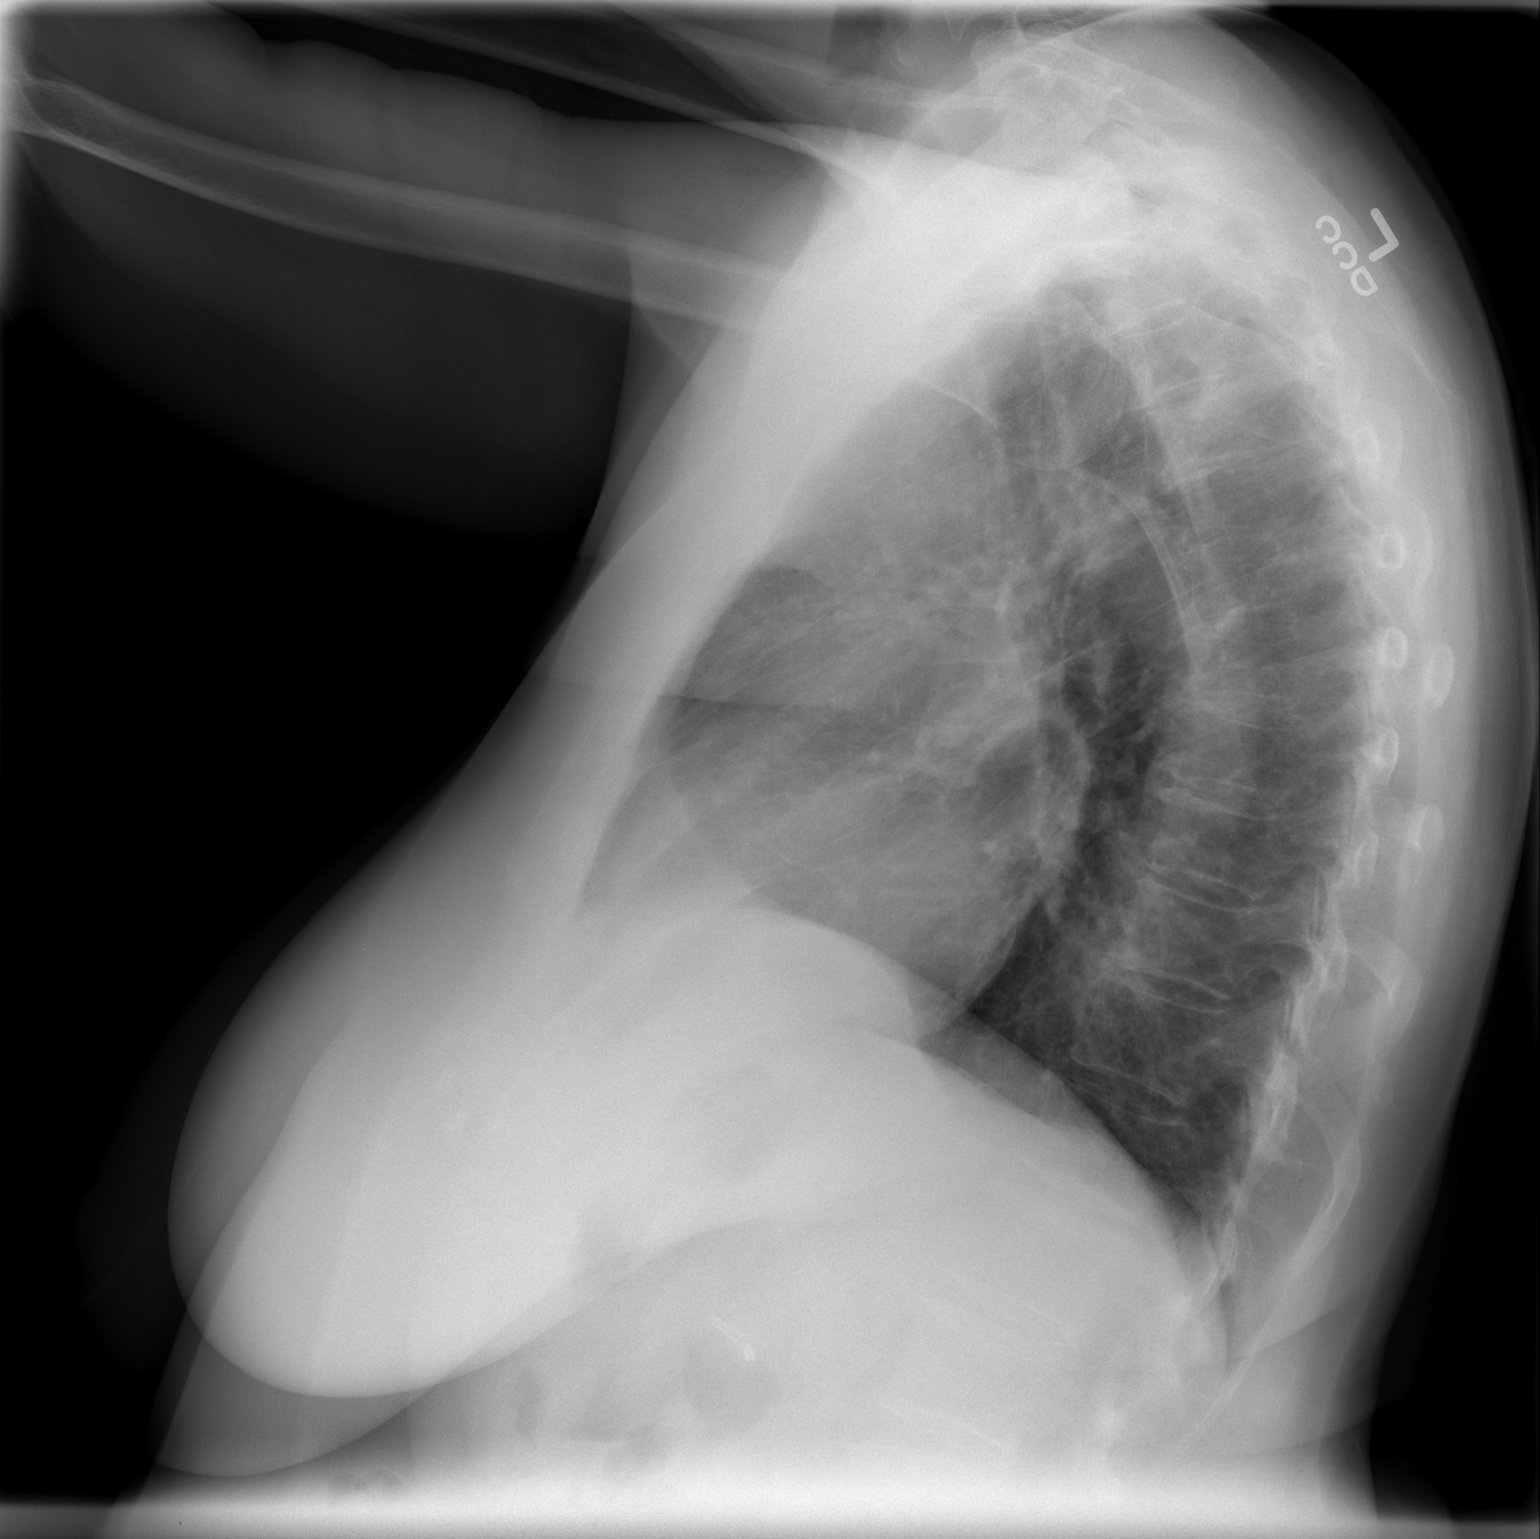

[2 of 2 positions shown; findings below may reference images not displayed]

FINDINGS: Lungs are clear without infiltrate or effusion.  Negative
for heart failure or mass lesion.  Lung volume is normal.
IMPRESSION: No acute abnormality.

## 2013-11-13 DIAGNOSIS — Z23 Encounter for immunization: Secondary | ICD-10-CM | POA: Diagnosis not present

## 2014-02-24 DIAGNOSIS — H25813 Combined forms of age-related cataract, bilateral: Secondary | ICD-10-CM | POA: Diagnosis not present

## 2014-03-11 DIAGNOSIS — Z1231 Encounter for screening mammogram for malignant neoplasm of breast: Secondary | ICD-10-CM | POA: Diagnosis not present

## 2014-05-12 DIAGNOSIS — R609 Edema, unspecified: Secondary | ICD-10-CM | POA: Diagnosis not present

## 2014-05-12 DIAGNOSIS — G2581 Restless legs syndrome: Secondary | ICD-10-CM | POA: Diagnosis not present

## 2014-05-12 DIAGNOSIS — I10 Essential (primary) hypertension: Secondary | ICD-10-CM | POA: Diagnosis not present

## 2014-05-12 DIAGNOSIS — J019 Acute sinusitis, unspecified: Secondary | ICD-10-CM | POA: Diagnosis not present

## 2014-05-12 DIAGNOSIS — Z79899 Other long term (current) drug therapy: Secondary | ICD-10-CM | POA: Diagnosis not present

## 2014-05-12 DIAGNOSIS — K210 Gastro-esophageal reflux disease with esophagitis: Secondary | ICD-10-CM | POA: Diagnosis not present

## 2014-05-12 DIAGNOSIS — E559 Vitamin D deficiency, unspecified: Secondary | ICD-10-CM | POA: Diagnosis not present

## 2014-05-12 DIAGNOSIS — N390 Urinary tract infection, site not specified: Secondary | ICD-10-CM | POA: Diagnosis not present

## 2014-05-12 DIAGNOSIS — J301 Allergic rhinitis due to pollen: Secondary | ICD-10-CM | POA: Diagnosis not present

## 2014-06-13 DIAGNOSIS — H2512 Age-related nuclear cataract, left eye: Secondary | ICD-10-CM | POA: Diagnosis not present

## 2014-06-24 DIAGNOSIS — K219 Gastro-esophageal reflux disease without esophagitis: Secondary | ICD-10-CM | POA: Diagnosis not present

## 2014-06-24 DIAGNOSIS — H2512 Age-related nuclear cataract, left eye: Secondary | ICD-10-CM | POA: Diagnosis not present

## 2014-06-24 DIAGNOSIS — H25812 Combined forms of age-related cataract, left eye: Secondary | ICD-10-CM | POA: Diagnosis not present

## 2014-06-24 DIAGNOSIS — H259 Unspecified age-related cataract: Secondary | ICD-10-CM | POA: Diagnosis not present

## 2014-06-24 DIAGNOSIS — G2581 Restless legs syndrome: Secondary | ICD-10-CM | POA: Diagnosis not present

## 2014-06-24 DIAGNOSIS — Z79899 Other long term (current) drug therapy: Secondary | ICD-10-CM | POA: Diagnosis not present

## 2014-06-24 DIAGNOSIS — I10 Essential (primary) hypertension: Secondary | ICD-10-CM | POA: Diagnosis not present

## 2014-07-29 DIAGNOSIS — R471 Dysarthria and anarthria: Secondary | ICD-10-CM | POA: Diagnosis not present

## 2014-07-29 DIAGNOSIS — K219 Gastro-esophageal reflux disease without esophagitis: Secondary | ICD-10-CM | POA: Diagnosis not present

## 2014-07-29 DIAGNOSIS — Z79899 Other long term (current) drug therapy: Secondary | ICD-10-CM | POA: Diagnosis not present

## 2014-07-29 DIAGNOSIS — I10 Essential (primary) hypertension: Secondary | ICD-10-CM | POA: Diagnosis not present

## 2014-07-29 DIAGNOSIS — G2581 Restless legs syndrome: Secondary | ICD-10-CM | POA: Diagnosis not present

## 2014-07-29 DIAGNOSIS — H2511 Age-related nuclear cataract, right eye: Secondary | ICD-10-CM | POA: Diagnosis not present

## 2014-07-29 DIAGNOSIS — H259 Unspecified age-related cataract: Secondary | ICD-10-CM | POA: Diagnosis not present

## 2014-07-29 DIAGNOSIS — E785 Hyperlipidemia, unspecified: Secondary | ICD-10-CM | POA: Diagnosis not present

## 2014-07-29 DIAGNOSIS — H25811 Combined forms of age-related cataract, right eye: Secondary | ICD-10-CM | POA: Diagnosis not present

## 2014-09-23 DIAGNOSIS — H6090 Unspecified otitis externa, unspecified ear: Secondary | ICD-10-CM | POA: Diagnosis not present

## 2014-09-23 DIAGNOSIS — Z6837 Body mass index (BMI) 37.0-37.9, adult: Secondary | ICD-10-CM | POA: Diagnosis not present

## 2014-10-07 DIAGNOSIS — L570 Actinic keratosis: Secondary | ICD-10-CM | POA: Diagnosis not present

## 2014-10-07 DIAGNOSIS — L821 Other seborrheic keratosis: Secondary | ICD-10-CM | POA: Diagnosis not present

## 2014-10-31 DIAGNOSIS — Z23 Encounter for immunization: Secondary | ICD-10-CM | POA: Diagnosis not present

## 2015-01-01 DIAGNOSIS — B373 Candidiasis of vulva and vagina: Secondary | ICD-10-CM | POA: Diagnosis not present

## 2015-01-01 DIAGNOSIS — I10 Essential (primary) hypertension: Secondary | ICD-10-CM | POA: Diagnosis not present

## 2015-01-01 DIAGNOSIS — G2581 Restless legs syndrome: Secondary | ICD-10-CM | POA: Diagnosis not present

## 2015-01-01 DIAGNOSIS — Z6835 Body mass index (BMI) 35.0-35.9, adult: Secondary | ICD-10-CM | POA: Diagnosis not present

## 2015-01-01 DIAGNOSIS — R300 Dysuria: Secondary | ICD-10-CM | POA: Diagnosis not present

## 2015-01-01 DIAGNOSIS — N390 Urinary tract infection, site not specified: Secondary | ICD-10-CM | POA: Diagnosis not present

## 2015-03-12 DIAGNOSIS — Z01419 Encounter for gynecological examination (general) (routine) without abnormal findings: Secondary | ICD-10-CM | POA: Diagnosis not present

## 2015-03-13 DIAGNOSIS — Z1231 Encounter for screening mammogram for malignant neoplasm of breast: Secondary | ICD-10-CM | POA: Diagnosis not present

## 2015-03-25 DIAGNOSIS — L820 Inflamed seborrheic keratosis: Secondary | ICD-10-CM | POA: Diagnosis not present

## 2015-03-25 DIAGNOSIS — D0462 Carcinoma in situ of skin of left upper limb, including shoulder: Secondary | ICD-10-CM | POA: Diagnosis not present

## 2015-03-25 DIAGNOSIS — L570 Actinic keratosis: Secondary | ICD-10-CM | POA: Diagnosis not present

## 2015-05-12 DIAGNOSIS — H04123 Dry eye syndrome of bilateral lacrimal glands: Secondary | ICD-10-CM | POA: Diagnosis not present

## 2015-06-23 DIAGNOSIS — L299 Pruritus, unspecified: Secondary | ICD-10-CM | POA: Diagnosis not present

## 2015-06-23 DIAGNOSIS — L821 Other seborrheic keratosis: Secondary | ICD-10-CM | POA: Diagnosis not present

## 2015-07-03 DIAGNOSIS — Z79899 Other long term (current) drug therapy: Secondary | ICD-10-CM | POA: Diagnosis not present

## 2015-07-03 DIAGNOSIS — R609 Edema, unspecified: Secondary | ICD-10-CM | POA: Diagnosis not present

## 2015-07-03 DIAGNOSIS — G2581 Restless legs syndrome: Secondary | ICD-10-CM | POA: Diagnosis not present

## 2015-07-03 DIAGNOSIS — E785 Hyperlipidemia, unspecified: Secondary | ICD-10-CM | POA: Diagnosis not present

## 2015-07-03 DIAGNOSIS — Z6835 Body mass index (BMI) 35.0-35.9, adult: Secondary | ICD-10-CM | POA: Diagnosis not present

## 2015-07-03 DIAGNOSIS — K210 Gastro-esophageal reflux disease with esophagitis: Secondary | ICD-10-CM | POA: Diagnosis not present

## 2015-07-03 DIAGNOSIS — Z0000 Encounter for general adult medical examination without abnormal findings: Secondary | ICD-10-CM | POA: Diagnosis not present

## 2015-07-03 DIAGNOSIS — Z1389 Encounter for screening for other disorder: Secondary | ICD-10-CM | POA: Diagnosis not present

## 2015-07-03 DIAGNOSIS — E669 Obesity, unspecified: Secondary | ICD-10-CM | POA: Diagnosis not present

## 2015-07-03 DIAGNOSIS — E559 Vitamin D deficiency, unspecified: Secondary | ICD-10-CM | POA: Diagnosis not present

## 2015-07-03 DIAGNOSIS — Z9181 History of falling: Secondary | ICD-10-CM | POA: Diagnosis not present

## 2015-07-03 DIAGNOSIS — N390 Urinary tract infection, site not specified: Secondary | ICD-10-CM | POA: Diagnosis not present

## 2015-07-03 DIAGNOSIS — I10 Essential (primary) hypertension: Secondary | ICD-10-CM | POA: Diagnosis not present

## 2015-11-13 DIAGNOSIS — Z23 Encounter for immunization: Secondary | ICD-10-CM | POA: Diagnosis not present

## 2016-02-10 DIAGNOSIS — J301 Allergic rhinitis due to pollen: Secondary | ICD-10-CM | POA: Diagnosis not present

## 2016-02-10 DIAGNOSIS — S299XXA Unspecified injury of thorax, initial encounter: Secondary | ICD-10-CM | POA: Diagnosis not present

## 2016-02-10 DIAGNOSIS — S20219A Contusion of unspecified front wall of thorax, initial encounter: Secondary | ICD-10-CM | POA: Diagnosis not present

## 2016-02-10 DIAGNOSIS — R0781 Pleurodynia: Secondary | ICD-10-CM | POA: Diagnosis not present

## 2016-02-10 DIAGNOSIS — Z6837 Body mass index (BMI) 37.0-37.9, adult: Secondary | ICD-10-CM | POA: Diagnosis not present

## 2016-03-14 DIAGNOSIS — R6889 Other general symptoms and signs: Secondary | ICD-10-CM | POA: Diagnosis not present

## 2016-03-14 DIAGNOSIS — K210 Gastro-esophageal reflux disease with esophagitis: Secondary | ICD-10-CM | POA: Diagnosis not present

## 2016-03-14 DIAGNOSIS — I10 Essential (primary) hypertension: Secondary | ICD-10-CM | POA: Diagnosis not present

## 2016-03-14 DIAGNOSIS — E669 Obesity, unspecified: Secondary | ICD-10-CM | POA: Diagnosis not present

## 2016-03-14 DIAGNOSIS — Z6837 Body mass index (BMI) 37.0-37.9, adult: Secondary | ICD-10-CM | POA: Diagnosis not present

## 2016-03-14 DIAGNOSIS — J301 Allergic rhinitis due to pollen: Secondary | ICD-10-CM | POA: Diagnosis not present

## 2016-03-14 DIAGNOSIS — J019 Acute sinusitis, unspecified: Secondary | ICD-10-CM | POA: Diagnosis not present

## 2016-03-14 DIAGNOSIS — I50810 Right heart failure, unspecified: Secondary | ICD-10-CM | POA: Diagnosis not present

## 2016-03-14 DIAGNOSIS — G2581 Restless legs syndrome: Secondary | ICD-10-CM | POA: Diagnosis not present

## 2016-03-24 ENCOUNTER — Encounter: Payer: Self-pay | Admitting: Gastroenterology

## 2016-04-22 DIAGNOSIS — Z1231 Encounter for screening mammogram for malignant neoplasm of breast: Secondary | ICD-10-CM | POA: Diagnosis not present

## 2016-05-04 DIAGNOSIS — N6002 Solitary cyst of left breast: Secondary | ICD-10-CM | POA: Diagnosis not present

## 2016-05-04 DIAGNOSIS — R928 Other abnormal and inconclusive findings on diagnostic imaging of breast: Secondary | ICD-10-CM | POA: Diagnosis not present

## 2016-05-04 DIAGNOSIS — N6320 Unspecified lump in the left breast, unspecified quadrant: Secondary | ICD-10-CM | POA: Diagnosis not present

## 2016-05-31 DIAGNOSIS — L821 Other seborrheic keratosis: Secondary | ICD-10-CM | POA: Diagnosis not present

## 2016-05-31 DIAGNOSIS — L578 Other skin changes due to chronic exposure to nonionizing radiation: Secondary | ICD-10-CM | POA: Diagnosis not present

## 2016-05-31 DIAGNOSIS — L820 Inflamed seborrheic keratosis: Secondary | ICD-10-CM | POA: Diagnosis not present

## 2016-06-29 DIAGNOSIS — N760 Acute vaginitis: Secondary | ICD-10-CM | POA: Diagnosis not present

## 2016-06-29 DIAGNOSIS — I50810 Right heart failure, unspecified: Secondary | ICD-10-CM | POA: Diagnosis not present

## 2016-06-29 DIAGNOSIS — G2581 Restless legs syndrome: Secondary | ICD-10-CM | POA: Diagnosis not present

## 2016-06-29 DIAGNOSIS — I10 Essential (primary) hypertension: Secondary | ICD-10-CM | POA: Diagnosis not present

## 2016-06-29 DIAGNOSIS — Z6837 Body mass index (BMI) 37.0-37.9, adult: Secondary | ICD-10-CM | POA: Diagnosis not present

## 2016-06-29 DIAGNOSIS — N390 Urinary tract infection, site not specified: Secondary | ICD-10-CM | POA: Diagnosis not present

## 2016-06-29 DIAGNOSIS — K210 Gastro-esophageal reflux disease with esophagitis: Secondary | ICD-10-CM | POA: Diagnosis not present

## 2016-06-29 DIAGNOSIS — E669 Obesity, unspecified: Secondary | ICD-10-CM | POA: Diagnosis not present

## 2016-07-01 DIAGNOSIS — H04123 Dry eye syndrome of bilateral lacrimal glands: Secondary | ICD-10-CM | POA: Diagnosis not present

## 2016-07-01 DIAGNOSIS — Z961 Presence of intraocular lens: Secondary | ICD-10-CM | POA: Diagnosis not present

## 2016-08-02 DIAGNOSIS — N952 Postmenopausal atrophic vaginitis: Secondary | ICD-10-CM | POA: Diagnosis not present

## 2016-08-02 DIAGNOSIS — R300 Dysuria: Secondary | ICD-10-CM | POA: Diagnosis not present

## 2016-08-02 DIAGNOSIS — N3090 Cystitis, unspecified without hematuria: Secondary | ICD-10-CM | POA: Diagnosis not present

## 2016-08-08 DIAGNOSIS — N3090 Cystitis, unspecified without hematuria: Secondary | ICD-10-CM | POA: Diagnosis not present

## 2016-08-08 DIAGNOSIS — N390 Urinary tract infection, site not specified: Secondary | ICD-10-CM | POA: Diagnosis not present

## 2016-08-30 DIAGNOSIS — N952 Postmenopausal atrophic vaginitis: Secondary | ICD-10-CM | POA: Diagnosis not present

## 2016-08-30 DIAGNOSIS — R300 Dysuria: Secondary | ICD-10-CM | POA: Diagnosis not present

## 2016-08-30 DIAGNOSIS — N3090 Cystitis, unspecified without hematuria: Secondary | ICD-10-CM | POA: Diagnosis not present

## 2016-10-18 DIAGNOSIS — N952 Postmenopausal atrophic vaginitis: Secondary | ICD-10-CM | POA: Diagnosis not present

## 2016-10-18 DIAGNOSIS — N3090 Cystitis, unspecified without hematuria: Secondary | ICD-10-CM | POA: Diagnosis not present

## 2016-10-31 DIAGNOSIS — R739 Hyperglycemia, unspecified: Secondary | ICD-10-CM | POA: Diagnosis not present

## 2016-10-31 DIAGNOSIS — Z0000 Encounter for general adult medical examination without abnormal findings: Secondary | ICD-10-CM | POA: Diagnosis not present

## 2016-10-31 DIAGNOSIS — K5900 Constipation, unspecified: Secondary | ICD-10-CM | POA: Diagnosis not present

## 2016-10-31 DIAGNOSIS — Z6834 Body mass index (BMI) 34.0-34.9, adult: Secondary | ICD-10-CM | POA: Diagnosis not present

## 2016-10-31 DIAGNOSIS — Z79899 Other long term (current) drug therapy: Secondary | ICD-10-CM | POA: Diagnosis not present

## 2016-10-31 DIAGNOSIS — Z9181 History of falling: Secondary | ICD-10-CM | POA: Diagnosis not present

## 2016-10-31 DIAGNOSIS — Z1389 Encounter for screening for other disorder: Secondary | ICD-10-CM | POA: Diagnosis not present

## 2016-10-31 DIAGNOSIS — E669 Obesity, unspecified: Secondary | ICD-10-CM | POA: Diagnosis not present

## 2016-10-31 DIAGNOSIS — I50810 Right heart failure, unspecified: Secondary | ICD-10-CM | POA: Diagnosis not present

## 2016-10-31 DIAGNOSIS — I10 Essential (primary) hypertension: Secondary | ICD-10-CM | POA: Diagnosis not present

## 2016-10-31 DIAGNOSIS — E559 Vitamin D deficiency, unspecified: Secondary | ICD-10-CM | POA: Diagnosis not present

## 2016-10-31 DIAGNOSIS — R609 Edema, unspecified: Secondary | ICD-10-CM | POA: Diagnosis not present

## 2016-10-31 DIAGNOSIS — K210 Gastro-esophageal reflux disease with esophagitis: Secondary | ICD-10-CM | POA: Diagnosis not present

## 2016-11-28 DIAGNOSIS — I889 Nonspecific lymphadenitis, unspecified: Secondary | ICD-10-CM | POA: Diagnosis not present

## 2016-11-28 DIAGNOSIS — B354 Tinea corporis: Secondary | ICD-10-CM | POA: Diagnosis not present

## 2016-11-28 DIAGNOSIS — J301 Allergic rhinitis due to pollen: Secondary | ICD-10-CM | POA: Diagnosis not present

## 2016-11-28 DIAGNOSIS — Z6834 Body mass index (BMI) 34.0-34.9, adult: Secondary | ICD-10-CM | POA: Diagnosis not present

## 2016-12-16 DIAGNOSIS — Z86010 Personal history of colonic polyps: Secondary | ICD-10-CM | POA: Diagnosis not present

## 2016-12-16 DIAGNOSIS — K5900 Constipation, unspecified: Secondary | ICD-10-CM | POA: Diagnosis not present

## 2016-12-16 DIAGNOSIS — Z1211 Encounter for screening for malignant neoplasm of colon: Secondary | ICD-10-CM | POA: Diagnosis not present

## 2016-12-19 DIAGNOSIS — N952 Postmenopausal atrophic vaginitis: Secondary | ICD-10-CM | POA: Diagnosis not present

## 2016-12-19 DIAGNOSIS — N3090 Cystitis, unspecified without hematuria: Secondary | ICD-10-CM | POA: Diagnosis not present

## 2016-12-28 DIAGNOSIS — K5730 Diverticulosis of large intestine without perforation or abscess without bleeding: Secondary | ICD-10-CM | POA: Diagnosis not present

## 2016-12-28 DIAGNOSIS — K635 Polyp of colon: Secondary | ICD-10-CM | POA: Diagnosis not present

## 2016-12-28 DIAGNOSIS — Z79899 Other long term (current) drug therapy: Secondary | ICD-10-CM | POA: Diagnosis not present

## 2016-12-28 DIAGNOSIS — D122 Benign neoplasm of ascending colon: Secondary | ICD-10-CM | POA: Diagnosis not present

## 2016-12-28 DIAGNOSIS — K648 Other hemorrhoids: Secondary | ICD-10-CM | POA: Diagnosis not present

## 2016-12-28 DIAGNOSIS — I10 Essential (primary) hypertension: Secondary | ICD-10-CM | POA: Diagnosis not present

## 2016-12-28 DIAGNOSIS — Z86010 Personal history of colonic polyps: Secondary | ICD-10-CM | POA: Diagnosis not present

## 2016-12-28 DIAGNOSIS — K219 Gastro-esophageal reflux disease without esophagitis: Secondary | ICD-10-CM | POA: Diagnosis not present

## 2016-12-28 DIAGNOSIS — K5900 Constipation, unspecified: Secondary | ICD-10-CM | POA: Diagnosis not present

## 2016-12-28 DIAGNOSIS — Z1211 Encounter for screening for malignant neoplasm of colon: Secondary | ICD-10-CM | POA: Diagnosis not present

## 2017-01-12 DIAGNOSIS — M1712 Unilateral primary osteoarthritis, left knee: Secondary | ICD-10-CM | POA: Diagnosis not present

## 2017-01-12 DIAGNOSIS — M170 Bilateral primary osteoarthritis of knee: Secondary | ICD-10-CM | POA: Diagnosis not present

## 2017-01-12 DIAGNOSIS — M1711 Unilateral primary osteoarthritis, right knee: Secondary | ICD-10-CM | POA: Diagnosis not present

## 2017-01-25 DIAGNOSIS — J189 Pneumonia, unspecified organism: Secondary | ICD-10-CM | POA: Diagnosis not present

## 2017-01-25 DIAGNOSIS — G2581 Restless legs syndrome: Secondary | ICD-10-CM | POA: Diagnosis not present

## 2017-01-25 DIAGNOSIS — J301 Allergic rhinitis due to pollen: Secondary | ICD-10-CM | POA: Diagnosis not present

## 2017-01-25 DIAGNOSIS — Z6835 Body mass index (BMI) 35.0-35.9, adult: Secondary | ICD-10-CM | POA: Diagnosis not present

## 2017-05-23 DIAGNOSIS — E669 Obesity, unspecified: Secondary | ICD-10-CM | POA: Diagnosis not present

## 2017-05-23 DIAGNOSIS — M25569 Pain in unspecified knee: Secondary | ICD-10-CM | POA: Diagnosis not present

## 2017-05-23 DIAGNOSIS — G2581 Restless legs syndrome: Secondary | ICD-10-CM | POA: Diagnosis not present

## 2017-05-23 DIAGNOSIS — N390 Urinary tract infection, site not specified: Secondary | ICD-10-CM | POA: Diagnosis not present

## 2017-05-23 DIAGNOSIS — Z6836 Body mass index (BMI) 36.0-36.9, adult: Secondary | ICD-10-CM | POA: Diagnosis not present

## 2017-05-23 DIAGNOSIS — K429 Umbilical hernia without obstruction or gangrene: Secondary | ICD-10-CM | POA: Diagnosis not present

## 2017-05-23 DIAGNOSIS — M25511 Pain in right shoulder: Secondary | ICD-10-CM | POA: Diagnosis not present

## 2017-06-19 DIAGNOSIS — B029 Zoster without complications: Secondary | ICD-10-CM | POA: Diagnosis not present

## 2017-06-19 DIAGNOSIS — Z6835 Body mass index (BMI) 35.0-35.9, adult: Secondary | ICD-10-CM | POA: Diagnosis not present

## 2017-07-04 DIAGNOSIS — M79674 Pain in right toe(s): Secondary | ICD-10-CM | POA: Diagnosis not present

## 2017-07-04 DIAGNOSIS — M89371 Hypertrophy of bone, right ankle and foot: Secondary | ICD-10-CM | POA: Insufficient documentation

## 2017-07-04 DIAGNOSIS — G8929 Other chronic pain: Secondary | ICD-10-CM | POA: Diagnosis not present

## 2017-07-05 DIAGNOSIS — G8929 Other chronic pain: Secondary | ICD-10-CM | POA: Insufficient documentation

## 2017-07-31 DIAGNOSIS — Z1339 Encounter for screening examination for other mental health and behavioral disorders: Secondary | ICD-10-CM | POA: Diagnosis not present

## 2017-07-31 DIAGNOSIS — J301 Allergic rhinitis due to pollen: Secondary | ICD-10-CM | POA: Diagnosis not present

## 2017-07-31 DIAGNOSIS — K210 Gastro-esophageal reflux disease with esophagitis: Secondary | ICD-10-CM | POA: Diagnosis not present

## 2017-07-31 DIAGNOSIS — N390 Urinary tract infection, site not specified: Secondary | ICD-10-CM | POA: Diagnosis not present

## 2017-07-31 DIAGNOSIS — E559 Vitamin D deficiency, unspecified: Secondary | ICD-10-CM | POA: Diagnosis not present

## 2017-07-31 DIAGNOSIS — I10 Essential (primary) hypertension: Secondary | ICD-10-CM | POA: Diagnosis not present

## 2017-07-31 DIAGNOSIS — I499 Cardiac arrhythmia, unspecified: Secondary | ICD-10-CM | POA: Diagnosis not present

## 2017-07-31 DIAGNOSIS — I50810 Right heart failure, unspecified: Secondary | ICD-10-CM | POA: Diagnosis not present

## 2017-07-31 DIAGNOSIS — Z6836 Body mass index (BMI) 36.0-36.9, adult: Secondary | ICD-10-CM | POA: Diagnosis not present

## 2017-07-31 DIAGNOSIS — R002 Palpitations: Secondary | ICD-10-CM | POA: Diagnosis not present

## 2017-08-01 DIAGNOSIS — Z961 Presence of intraocular lens: Secondary | ICD-10-CM | POA: Diagnosis not present

## 2017-08-08 DIAGNOSIS — I10 Essential (primary) hypertension: Secondary | ICD-10-CM | POA: Diagnosis not present

## 2017-08-08 DIAGNOSIS — Z6836 Body mass index (BMI) 36.0-36.9, adult: Secondary | ICD-10-CM | POA: Diagnosis not present

## 2017-08-08 DIAGNOSIS — J301 Allergic rhinitis due to pollen: Secondary | ICD-10-CM | POA: Diagnosis not present

## 2017-08-08 DIAGNOSIS — J019 Acute sinusitis, unspecified: Secondary | ICD-10-CM | POA: Diagnosis not present

## 2017-08-08 DIAGNOSIS — J029 Acute pharyngitis, unspecified: Secondary | ICD-10-CM | POA: Diagnosis not present

## 2017-08-08 DIAGNOSIS — N390 Urinary tract infection, site not specified: Secondary | ICD-10-CM | POA: Diagnosis not present

## 2017-08-08 DIAGNOSIS — M1990 Unspecified osteoarthritis, unspecified site: Secondary | ICD-10-CM | POA: Diagnosis not present

## 2017-08-08 DIAGNOSIS — I50810 Right heart failure, unspecified: Secondary | ICD-10-CM | POA: Diagnosis not present

## 2017-08-08 DIAGNOSIS — K210 Gastro-esophageal reflux disease with esophagitis: Secondary | ICD-10-CM | POA: Diagnosis not present

## 2017-08-08 DIAGNOSIS — G2581 Restless legs syndrome: Secondary | ICD-10-CM | POA: Diagnosis not present

## 2017-08-22 DIAGNOSIS — E876 Hypokalemia: Secondary | ICD-10-CM | POA: Diagnosis not present

## 2017-10-26 DIAGNOSIS — T63301A Toxic effect of unspecified spider venom, accidental (unintentional), initial encounter: Secondary | ICD-10-CM | POA: Diagnosis not present

## 2017-10-26 DIAGNOSIS — Z6836 Body mass index (BMI) 36.0-36.9, adult: Secondary | ICD-10-CM | POA: Diagnosis not present

## 2017-11-02 DIAGNOSIS — Z6836 Body mass index (BMI) 36.0-36.9, adult: Secondary | ICD-10-CM | POA: Diagnosis not present

## 2017-11-02 DIAGNOSIS — L03115 Cellulitis of right lower limb: Secondary | ICD-10-CM | POA: Diagnosis not present

## 2018-01-02 DIAGNOSIS — M1712 Unilateral primary osteoarthritis, left knee: Secondary | ICD-10-CM | POA: Diagnosis not present

## 2018-01-25 DIAGNOSIS — Z6836 Body mass index (BMI) 36.0-36.9, adult: Secondary | ICD-10-CM | POA: Diagnosis not present

## 2018-01-25 DIAGNOSIS — R300 Dysuria: Secondary | ICD-10-CM | POA: Diagnosis not present

## 2018-04-02 DIAGNOSIS — M1712 Unilateral primary osteoarthritis, left knee: Secondary | ICD-10-CM | POA: Diagnosis not present

## 2018-04-04 DIAGNOSIS — R928 Other abnormal and inconclusive findings on diagnostic imaging of breast: Secondary | ICD-10-CM | POA: Diagnosis not present

## 2018-04-09 DIAGNOSIS — N390 Urinary tract infection, site not specified: Secondary | ICD-10-CM | POA: Diagnosis not present

## 2018-04-09 DIAGNOSIS — R739 Hyperglycemia, unspecified: Secondary | ICD-10-CM | POA: Diagnosis not present

## 2018-04-09 DIAGNOSIS — Z6836 Body mass index (BMI) 36.0-36.9, adult: Secondary | ICD-10-CM | POA: Diagnosis not present

## 2018-04-09 DIAGNOSIS — M1990 Unspecified osteoarthritis, unspecified site: Secondary | ICD-10-CM | POA: Diagnosis not present

## 2018-04-09 DIAGNOSIS — I10 Essential (primary) hypertension: Secondary | ICD-10-CM | POA: Diagnosis not present

## 2018-04-09 DIAGNOSIS — I50810 Right heart failure, unspecified: Secondary | ICD-10-CM | POA: Diagnosis not present

## 2018-04-09 DIAGNOSIS — Z1331 Encounter for screening for depression: Secondary | ICD-10-CM | POA: Diagnosis not present

## 2018-04-09 DIAGNOSIS — Z9181 History of falling: Secondary | ICD-10-CM | POA: Diagnosis not present

## 2018-04-09 DIAGNOSIS — G2581 Restless legs syndrome: Secondary | ICD-10-CM | POA: Diagnosis not present

## 2018-04-09 DIAGNOSIS — K210 Gastro-esophageal reflux disease with esophagitis: Secondary | ICD-10-CM | POA: Diagnosis not present

## 2018-04-09 DIAGNOSIS — Z79899 Other long term (current) drug therapy: Secondary | ICD-10-CM | POA: Diagnosis not present

## 2018-04-24 DIAGNOSIS — Z9181 History of falling: Secondary | ICD-10-CM | POA: Diagnosis not present

## 2018-04-24 DIAGNOSIS — Z6836 Body mass index (BMI) 36.0-36.9, adult: Secondary | ICD-10-CM | POA: Diagnosis not present

## 2018-04-24 DIAGNOSIS — J301 Allergic rhinitis due to pollen: Secondary | ICD-10-CM | POA: Diagnosis not present

## 2018-04-24 DIAGNOSIS — J029 Acute pharyngitis, unspecified: Secondary | ICD-10-CM | POA: Diagnosis not present

## 2018-04-24 DIAGNOSIS — R6889 Other general symptoms and signs: Secondary | ICD-10-CM | POA: Diagnosis not present

## 2018-04-24 DIAGNOSIS — J019 Acute sinusitis, unspecified: Secondary | ICD-10-CM | POA: Diagnosis not present

## 2018-04-24 DIAGNOSIS — Z1331 Encounter for screening for depression: Secondary | ICD-10-CM | POA: Diagnosis not present

## 2018-04-24 DIAGNOSIS — J101 Influenza due to other identified influenza virus with other respiratory manifestations: Secondary | ICD-10-CM | POA: Diagnosis not present

## 2018-06-19 DIAGNOSIS — B349 Viral infection, unspecified: Secondary | ICD-10-CM | POA: Diagnosis not present

## 2018-06-19 DIAGNOSIS — J069 Acute upper respiratory infection, unspecified: Secondary | ICD-10-CM | POA: Diagnosis not present

## 2018-07-16 DIAGNOSIS — M1712 Unilateral primary osteoarthritis, left knee: Secondary | ICD-10-CM | POA: Diagnosis not present

## 2018-08-06 DIAGNOSIS — Z79899 Other long term (current) drug therapy: Secondary | ICD-10-CM | POA: Diagnosis not present

## 2018-08-06 DIAGNOSIS — M79609 Pain in unspecified limb: Secondary | ICD-10-CM | POA: Diagnosis not present

## 2018-08-06 DIAGNOSIS — Z01818 Encounter for other preprocedural examination: Secondary | ICD-10-CM | POA: Diagnosis not present

## 2018-08-06 DIAGNOSIS — R52 Pain, unspecified: Secondary | ICD-10-CM | POA: Diagnosis not present

## 2018-08-06 DIAGNOSIS — E559 Vitamin D deficiency, unspecified: Secondary | ICD-10-CM | POA: Diagnosis not present

## 2018-08-13 DIAGNOSIS — I10 Essential (primary) hypertension: Secondary | ICD-10-CM | POA: Diagnosis not present

## 2018-08-13 DIAGNOSIS — E559 Vitamin D deficiency, unspecified: Secondary | ICD-10-CM | POA: Diagnosis not present

## 2018-08-13 DIAGNOSIS — Q742 Other congenital malformations of lower limb(s), including pelvic girdle: Secondary | ICD-10-CM | POA: Insufficient documentation

## 2018-08-13 DIAGNOSIS — M2041 Other hammer toe(s) (acquired), right foot: Secondary | ICD-10-CM | POA: Diagnosis not present

## 2018-08-13 DIAGNOSIS — M1990 Unspecified osteoarthritis, unspecified site: Secondary | ICD-10-CM | POA: Diagnosis not present

## 2018-08-13 DIAGNOSIS — G2581 Restless legs syndrome: Secondary | ICD-10-CM | POA: Diagnosis not present

## 2018-08-13 DIAGNOSIS — L84 Corns and callosities: Secondary | ICD-10-CM | POA: Diagnosis not present

## 2018-08-13 DIAGNOSIS — N390 Urinary tract infection, site not specified: Secondary | ICD-10-CM | POA: Diagnosis not present

## 2018-08-13 DIAGNOSIS — K210 Gastro-esophageal reflux disease with esophagitis: Secondary | ICD-10-CM | POA: Diagnosis not present

## 2018-08-13 DIAGNOSIS — M2042 Other hammer toe(s) (acquired), left foot: Secondary | ICD-10-CM | POA: Diagnosis not present

## 2018-08-13 DIAGNOSIS — L603 Nail dystrophy: Secondary | ICD-10-CM | POA: Diagnosis not present

## 2018-08-13 DIAGNOSIS — Z6835 Body mass index (BMI) 35.0-35.9, adult: Secondary | ICD-10-CM | POA: Diagnosis not present

## 2018-08-13 DIAGNOSIS — I50810 Right heart failure, unspecified: Secondary | ICD-10-CM | POA: Diagnosis not present

## 2018-09-10 DIAGNOSIS — Z79899 Other long term (current) drug therapy: Secondary | ICD-10-CM | POA: Diagnosis not present

## 2018-09-10 DIAGNOSIS — R52 Pain, unspecified: Secondary | ICD-10-CM | POA: Diagnosis not present

## 2018-09-10 DIAGNOSIS — E559 Vitamin D deficiency, unspecified: Secondary | ICD-10-CM | POA: Diagnosis not present

## 2018-09-10 DIAGNOSIS — M1712 Unilateral primary osteoarthritis, left knee: Secondary | ICD-10-CM | POA: Diagnosis not present

## 2018-09-10 DIAGNOSIS — Z01818 Encounter for other preprocedural examination: Secondary | ICD-10-CM | POA: Diagnosis not present

## 2018-09-10 DIAGNOSIS — M79609 Pain in unspecified limb: Secondary | ICD-10-CM | POA: Diagnosis not present

## 2018-09-18 DIAGNOSIS — Z79899 Other long term (current) drug therapy: Secondary | ICD-10-CM | POA: Diagnosis not present

## 2018-09-18 DIAGNOSIS — K5909 Other constipation: Secondary | ICD-10-CM | POA: Diagnosis not present

## 2018-09-18 DIAGNOSIS — Z7952 Long term (current) use of systemic steroids: Secondary | ICD-10-CM | POA: Diagnosis not present

## 2018-09-18 DIAGNOSIS — Z79891 Long term (current) use of opiate analgesic: Secondary | ICD-10-CM | POA: Diagnosis not present

## 2018-09-18 DIAGNOSIS — M810 Age-related osteoporosis without current pathological fracture: Secondary | ICD-10-CM | POA: Diagnosis not present

## 2018-09-18 DIAGNOSIS — E876 Hypokalemia: Secondary | ICD-10-CM | POA: Diagnosis not present

## 2018-09-18 DIAGNOSIS — Z87891 Personal history of nicotine dependence: Secondary | ICD-10-CM | POA: Diagnosis not present

## 2018-09-18 DIAGNOSIS — M1712 Unilateral primary osteoarthritis, left knee: Secondary | ICD-10-CM | POA: Diagnosis not present

## 2018-09-18 DIAGNOSIS — Z471 Aftercare following joint replacement surgery: Secondary | ICD-10-CM | POA: Diagnosis not present

## 2018-09-18 DIAGNOSIS — J302 Other seasonal allergic rhinitis: Secondary | ICD-10-CM | POA: Diagnosis not present

## 2018-09-18 DIAGNOSIS — K219 Gastro-esophageal reflux disease without esophagitis: Secondary | ICD-10-CM | POA: Diagnosis not present

## 2018-09-18 DIAGNOSIS — G4700 Insomnia, unspecified: Secondary | ICD-10-CM | POA: Diagnosis not present

## 2018-09-18 DIAGNOSIS — I10 Essential (primary) hypertension: Secondary | ICD-10-CM | POA: Diagnosis not present

## 2018-09-18 DIAGNOSIS — R269 Unspecified abnormalities of gait and mobility: Secondary | ICD-10-CM | POA: Diagnosis not present

## 2018-09-18 DIAGNOSIS — E669 Obesity, unspecified: Secondary | ICD-10-CM | POA: Diagnosis not present

## 2018-09-18 DIAGNOSIS — Z6834 Body mass index (BMI) 34.0-34.9, adult: Secondary | ICD-10-CM | POA: Diagnosis not present

## 2018-09-18 DIAGNOSIS — Z96652 Presence of left artificial knee joint: Secondary | ICD-10-CM | POA: Diagnosis not present

## 2018-09-21 DIAGNOSIS — Z471 Aftercare following joint replacement surgery: Secondary | ICD-10-CM | POA: Diagnosis not present

## 2018-09-21 DIAGNOSIS — Z20828 Contact with and (suspected) exposure to other viral communicable diseases: Secondary | ICD-10-CM | POA: Diagnosis not present

## 2018-09-21 DIAGNOSIS — Z96652 Presence of left artificial knee joint: Secondary | ICD-10-CM | POA: Diagnosis not present

## 2018-09-21 DIAGNOSIS — K559 Vascular disorder of intestine, unspecified: Secondary | ICD-10-CM | POA: Diagnosis not present

## 2018-09-21 DIAGNOSIS — G4700 Insomnia, unspecified: Secondary | ICD-10-CM | POA: Diagnosis not present

## 2018-09-21 DIAGNOSIS — Z6836 Body mass index (BMI) 36.0-36.9, adult: Secondary | ICD-10-CM | POA: Diagnosis not present

## 2018-09-21 DIAGNOSIS — E669 Obesity, unspecified: Secondary | ICD-10-CM | POA: Diagnosis not present

## 2018-09-21 DIAGNOSIS — I110 Hypertensive heart disease with heart failure: Secondary | ICD-10-CM | POA: Diagnosis not present

## 2018-09-21 DIAGNOSIS — M810 Age-related osteoporosis without current pathological fracture: Secondary | ICD-10-CM | POA: Diagnosis not present

## 2018-09-21 DIAGNOSIS — I50810 Right heart failure, unspecified: Secondary | ICD-10-CM | POA: Diagnosis not present

## 2018-09-21 DIAGNOSIS — G2581 Restless legs syndrome: Secondary | ICD-10-CM | POA: Diagnosis not present

## 2018-09-21 DIAGNOSIS — K210 Gastro-esophageal reflux disease with esophagitis: Secondary | ICD-10-CM | POA: Diagnosis not present

## 2018-09-21 DIAGNOSIS — N390 Urinary tract infection, site not specified: Secondary | ICD-10-CM | POA: Diagnosis not present

## 2018-09-21 DIAGNOSIS — J301 Allergic rhinitis due to pollen: Secondary | ICD-10-CM | POA: Diagnosis not present

## 2018-09-21 DIAGNOSIS — K429 Umbilical hernia without obstruction or gangrene: Secondary | ICD-10-CM | POA: Diagnosis not present

## 2018-09-21 DIAGNOSIS — N952 Postmenopausal atrophic vaginitis: Secondary | ICD-10-CM | POA: Diagnosis not present

## 2018-09-21 DIAGNOSIS — R739 Hyperglycemia, unspecified: Secondary | ICD-10-CM | POA: Diagnosis not present

## 2018-09-21 DIAGNOSIS — Z7982 Long term (current) use of aspirin: Secondary | ICD-10-CM | POA: Diagnosis not present

## 2018-10-02 DIAGNOSIS — R2689 Other abnormalities of gait and mobility: Secondary | ICD-10-CM | POA: Diagnosis not present

## 2018-10-02 DIAGNOSIS — M25562 Pain in left knee: Secondary | ICD-10-CM | POA: Diagnosis not present

## 2018-10-02 DIAGNOSIS — M25662 Stiffness of left knee, not elsewhere classified: Secondary | ICD-10-CM | POA: Diagnosis not present

## 2018-10-02 DIAGNOSIS — M6281 Muscle weakness (generalized): Secondary | ICD-10-CM | POA: Diagnosis not present

## 2018-10-04 DIAGNOSIS — M6281 Muscle weakness (generalized): Secondary | ICD-10-CM | POA: Diagnosis not present

## 2018-10-04 DIAGNOSIS — M25662 Stiffness of left knee, not elsewhere classified: Secondary | ICD-10-CM | POA: Diagnosis not present

## 2018-10-04 DIAGNOSIS — M25562 Pain in left knee: Secondary | ICD-10-CM | POA: Diagnosis not present

## 2018-10-04 DIAGNOSIS — R2689 Other abnormalities of gait and mobility: Secondary | ICD-10-CM | POA: Diagnosis not present

## 2018-10-08 DIAGNOSIS — M25562 Pain in left knee: Secondary | ICD-10-CM | POA: Diagnosis not present

## 2018-10-08 DIAGNOSIS — M6281 Muscle weakness (generalized): Secondary | ICD-10-CM | POA: Diagnosis not present

## 2018-10-08 DIAGNOSIS — M25662 Stiffness of left knee, not elsewhere classified: Secondary | ICD-10-CM | POA: Diagnosis not present

## 2018-10-08 DIAGNOSIS — R2689 Other abnormalities of gait and mobility: Secondary | ICD-10-CM | POA: Diagnosis not present

## 2018-10-12 DIAGNOSIS — M25562 Pain in left knee: Secondary | ICD-10-CM | POA: Diagnosis not present

## 2018-10-12 DIAGNOSIS — R2689 Other abnormalities of gait and mobility: Secondary | ICD-10-CM | POA: Diagnosis not present

## 2018-10-12 DIAGNOSIS — M6281 Muscle weakness (generalized): Secondary | ICD-10-CM | POA: Diagnosis not present

## 2018-10-12 DIAGNOSIS — M25662 Stiffness of left knee, not elsewhere classified: Secondary | ICD-10-CM | POA: Diagnosis not present

## 2018-10-15 DIAGNOSIS — R2689 Other abnormalities of gait and mobility: Secondary | ICD-10-CM | POA: Diagnosis not present

## 2018-10-15 DIAGNOSIS — M25662 Stiffness of left knee, not elsewhere classified: Secondary | ICD-10-CM | POA: Diagnosis not present

## 2018-10-15 DIAGNOSIS — M25562 Pain in left knee: Secondary | ICD-10-CM | POA: Diagnosis not present

## 2018-10-15 DIAGNOSIS — M6281 Muscle weakness (generalized): Secondary | ICD-10-CM | POA: Diagnosis not present

## 2018-10-17 DIAGNOSIS — R2689 Other abnormalities of gait and mobility: Secondary | ICD-10-CM | POA: Diagnosis not present

## 2018-10-17 DIAGNOSIS — M25562 Pain in left knee: Secondary | ICD-10-CM | POA: Diagnosis not present

## 2018-10-17 DIAGNOSIS — M6281 Muscle weakness (generalized): Secondary | ICD-10-CM | POA: Diagnosis not present

## 2018-10-17 DIAGNOSIS — M25662 Stiffness of left knee, not elsewhere classified: Secondary | ICD-10-CM | POA: Diagnosis not present

## 2018-11-01 DIAGNOSIS — M1712 Unilateral primary osteoarthritis, left knee: Secondary | ICD-10-CM | POA: Diagnosis not present

## 2018-11-01 DIAGNOSIS — Z96652 Presence of left artificial knee joint: Secondary | ICD-10-CM | POA: Diagnosis not present

## 2018-11-26 DIAGNOSIS — K2100 Gastro-esophageal reflux disease with esophagitis, without bleeding: Secondary | ICD-10-CM | POA: Diagnosis not present

## 2018-11-26 DIAGNOSIS — I50810 Right heart failure, unspecified: Secondary | ICD-10-CM | POA: Diagnosis not present

## 2018-11-26 DIAGNOSIS — Z79899 Other long term (current) drug therapy: Secondary | ICD-10-CM | POA: Diagnosis not present

## 2018-11-26 DIAGNOSIS — G2581 Restless legs syndrome: Secondary | ICD-10-CM | POA: Diagnosis not present

## 2018-11-26 DIAGNOSIS — N390 Urinary tract infection, site not specified: Secondary | ICD-10-CM | POA: Diagnosis not present

## 2018-11-26 DIAGNOSIS — Z6834 Body mass index (BMI) 34.0-34.9, adult: Secondary | ICD-10-CM | POA: Diagnosis not present

## 2018-11-26 DIAGNOSIS — I10 Essential (primary) hypertension: Secondary | ICD-10-CM | POA: Diagnosis not present

## 2018-11-26 DIAGNOSIS — E559 Vitamin D deficiency, unspecified: Secondary | ICD-10-CM | POA: Diagnosis not present

## 2019-02-11 DIAGNOSIS — M1990 Unspecified osteoarthritis, unspecified site: Secondary | ICD-10-CM | POA: Diagnosis not present

## 2019-02-11 DIAGNOSIS — Z6834 Body mass index (BMI) 34.0-34.9, adult: Secondary | ICD-10-CM | POA: Diagnosis not present

## 2019-02-11 DIAGNOSIS — R300 Dysuria: Secondary | ICD-10-CM | POA: Diagnosis not present

## 2019-02-11 DIAGNOSIS — N390 Urinary tract infection, site not specified: Secondary | ICD-10-CM | POA: Diagnosis not present

## 2019-02-11 DIAGNOSIS — I50810 Right heart failure, unspecified: Secondary | ICD-10-CM | POA: Diagnosis not present

## 2019-02-11 DIAGNOSIS — E559 Vitamin D deficiency, unspecified: Secondary | ICD-10-CM | POA: Diagnosis not present

## 2019-02-11 DIAGNOSIS — I10 Essential (primary) hypertension: Secondary | ICD-10-CM | POA: Diagnosis not present

## 2019-02-11 DIAGNOSIS — G2581 Restless legs syndrome: Secondary | ICD-10-CM | POA: Diagnosis not present

## 2019-02-25 DIAGNOSIS — N3090 Cystitis, unspecified without hematuria: Secondary | ICD-10-CM | POA: Diagnosis not present

## 2019-02-25 DIAGNOSIS — N952 Postmenopausal atrophic vaginitis: Secondary | ICD-10-CM | POA: Diagnosis not present

## 2019-06-03 DIAGNOSIS — M7061 Trochanteric bursitis, right hip: Secondary | ICD-10-CM | POA: Diagnosis not present

## 2019-06-17 DIAGNOSIS — M7061 Trochanteric bursitis, right hip: Secondary | ICD-10-CM | POA: Diagnosis not present

## 2019-08-02 DIAGNOSIS — Z1231 Encounter for screening mammogram for malignant neoplasm of breast: Secondary | ICD-10-CM | POA: Diagnosis not present

## 2019-08-27 DIAGNOSIS — I10 Essential (primary) hypertension: Secondary | ICD-10-CM | POA: Diagnosis not present

## 2019-08-27 DIAGNOSIS — R1033 Periumbilical pain: Secondary | ICD-10-CM | POA: Diagnosis not present

## 2019-08-27 DIAGNOSIS — K420 Umbilical hernia with obstruction, without gangrene: Secondary | ICD-10-CM | POA: Diagnosis not present

## 2019-09-02 DIAGNOSIS — R110 Nausea: Secondary | ICD-10-CM | POA: Diagnosis not present

## 2019-09-02 DIAGNOSIS — I10 Essential (primary) hypertension: Secondary | ICD-10-CM | POA: Diagnosis not present

## 2019-09-02 DIAGNOSIS — K430 Incisional hernia with obstruction, without gangrene: Secondary | ICD-10-CM | POA: Diagnosis not present

## 2019-09-02 DIAGNOSIS — R001 Bradycardia, unspecified: Secondary | ICD-10-CM | POA: Diagnosis not present

## 2019-09-16 DIAGNOSIS — Z79899 Other long term (current) drug therapy: Secondary | ICD-10-CM | POA: Diagnosis not present

## 2019-09-16 DIAGNOSIS — I50810 Right heart failure, unspecified: Secondary | ICD-10-CM | POA: Diagnosis not present

## 2019-09-16 DIAGNOSIS — Z1331 Encounter for screening for depression: Secondary | ICD-10-CM | POA: Diagnosis not present

## 2019-09-16 DIAGNOSIS — R739 Hyperglycemia, unspecified: Secondary | ICD-10-CM | POA: Diagnosis not present

## 2019-09-16 DIAGNOSIS — I10 Essential (primary) hypertension: Secondary | ICD-10-CM | POA: Diagnosis not present

## 2019-09-16 DIAGNOSIS — Z9181 History of falling: Secondary | ICD-10-CM | POA: Diagnosis not present

## 2019-09-16 DIAGNOSIS — G2581 Restless legs syndrome: Secondary | ICD-10-CM | POA: Diagnosis not present

## 2019-09-16 DIAGNOSIS — Z139 Encounter for screening, unspecified: Secondary | ICD-10-CM | POA: Diagnosis not present

## 2019-09-16 DIAGNOSIS — N390 Urinary tract infection, site not specified: Secondary | ICD-10-CM | POA: Diagnosis not present

## 2019-09-16 DIAGNOSIS — Z6834 Body mass index (BMI) 34.0-34.9, adult: Secondary | ICD-10-CM | POA: Diagnosis not present

## 2019-09-16 DIAGNOSIS — E559 Vitamin D deficiency, unspecified: Secondary | ICD-10-CM | POA: Diagnosis not present

## 2019-10-07 DIAGNOSIS — R300 Dysuria: Secondary | ICD-10-CM | POA: Diagnosis not present

## 2019-10-07 DIAGNOSIS — N390 Urinary tract infection, site not specified: Secondary | ICD-10-CM | POA: Diagnosis not present

## 2019-10-07 DIAGNOSIS — E876 Hypokalemia: Secondary | ICD-10-CM | POA: Diagnosis not present

## 2019-12-02 DIAGNOSIS — R002 Palpitations: Secondary | ICD-10-CM | POA: Diagnosis not present

## 2019-12-02 DIAGNOSIS — I50810 Right heart failure, unspecified: Secondary | ICD-10-CM | POA: Diagnosis not present

## 2019-12-02 DIAGNOSIS — Z6834 Body mass index (BMI) 34.0-34.9, adult: Secondary | ICD-10-CM | POA: Diagnosis not present

## 2019-12-02 DIAGNOSIS — I10 Essential (primary) hypertension: Secondary | ICD-10-CM | POA: Diagnosis not present

## 2019-12-03 ENCOUNTER — Encounter: Payer: Self-pay | Admitting: Cardiology

## 2019-12-03 ENCOUNTER — Ambulatory Visit (INDEPENDENT_AMBULATORY_CARE_PROVIDER_SITE_OTHER): Payer: PPO

## 2019-12-03 ENCOUNTER — Ambulatory Visit: Payer: PPO | Admitting: Cardiology

## 2019-12-03 ENCOUNTER — Other Ambulatory Visit: Payer: Self-pay

## 2019-12-03 VITALS — BP 104/86 | HR 67 | Ht 65.0 in | Wt 209.4 lb

## 2019-12-03 DIAGNOSIS — R002 Palpitations: Secondary | ICD-10-CM

## 2019-12-03 DIAGNOSIS — I10 Essential (primary) hypertension: Secondary | ICD-10-CM

## 2019-12-03 DIAGNOSIS — R0602 Shortness of breath: Secondary | ICD-10-CM | POA: Diagnosis not present

## 2019-12-03 DIAGNOSIS — R011 Cardiac murmur, unspecified: Secondary | ICD-10-CM | POA: Diagnosis not present

## 2019-12-03 NOTE — Addendum Note (Signed)
Addended by: Birder Robson on: 12/03/2019 04:30 PM   Modules accepted: Orders

## 2019-12-03 NOTE — Patient Instructions (Signed)
Medication Instructions:  No medication changes. *If you need a refill on your cardiac medications before your next appointment, please call your pharmacy*   Lab Work: None ordered If you have labs (blood work) drawn today and your tests are completely normal, you will receive your results only by: Marland Kitchen MyChart Message (if you have MyChart) OR . A paper copy in the mail If you have any lab test that is abnormal or we need to change your treatment, we will call you to review the results.   Testing/Procedures: Your physician has requested that you have an echocardiogram. Echocardiography is a painless test that uses sound waves to create images of your heart. It provides your doctor with information about the size and shape of your heart and how well your heart's chambers and valves are working. This procedure takes approximately one hour. There are no restrictions for this procedure.   WHY IS MY DOCTOR PRESCRIBING ZIO? The Zio system is proven and trusted by physicians to detect and diagnose irregular heart rhythms -- and has been prescribed to hundreds of thousands of patients.  The FDA has cleared the Zio system to monitor for many different kinds of irregular heart rhythms. In a study, physicians were able to reach a diagnosis 90% of the time with the Zio system1.  You can wear the Zio monitor -- a small, discreet, comfortable patch -- during your normal day-to-day activity, including while you sleep, shower, and exercise, while it records every single heartbeat for analysis.  1Barrett, P., et al. Comparison of 24 Hour Holter Monitoring Versus 14 Day Novel Adhesive Patch Electrocardiographic Monitoring. [place], 2014.  ZIO VS. HOLTER MONITORING The Zio monitor can be comfortably worn for up to 14 days. Holter monitors can be worn for 24 to 48 hours, limiting the time to record any irregular heart rhythms you may have. Zio is able to capture data for the 51% of patients  who have their first symptom-triggered arrhythmia after 48 hours.1  LIVE WITHOUT RESTRICTIONS The Zio ambulatory cardiac monitor is a small, unobtrusive, and water-resistant patch--you might even forget you're wearing it. The Zio monitor records and stores every beat of your heart, whether you're sleeping, working out, or showering.  Wear the monitor for 14 days, remove 12/17/19.   Follow-Up: At [facility], you and your health needs are our priority.  As part of our continuing mission to provide you with exceptional heart care, we have created designated Provider Care Teams.  These Care Teams include your primary Cardiologist (physician) and Advanced Practice Providers (APPs -  Physician Assistants and Nurse Practitioners) who all work together to provide you with the care you need, when you need it.  We recommend signing up for the patient portal called "MyChart".  Sign up information is provided on this After Visit Summary.  MyChart is used to connect with patients for Virtual Visits (Telemedicine).  Patients are able to view lab/test results, encounter notes, upcoming appointments, etc.  Non-urgent messages can be sent to your provider as well.   To learn more about what you can do with MyChart, go to [email or web address].    Your next appointment:   8 week(s)  The format for your next appointment:   In Person  Provider:   Berniece Salines, DO   Other Instructions  Echocardiogram An echocardiogram is a procedure that uses painless sound waves (ultrasound) to produce an image of the heart. Images from an echocardiogram can provide important information about:  Signs of  coronary artery disease (CAD).  Aneurysm detection. An aneurysm is a weak or damaged part of an artery wall that bulges out from the normal force of blood pumping through the body.  Heart size and shape. Changes in the size or shape of the heart can be associated with certain conditions, including heart failure,  aneurysm, and CAD.  Heart muscle function.  Heart valve function.  Signs of a past heart attack.  Fluid buildup around the heart.  Thickening of the heart muscle.  A tumor or infectious growth around the heart valves. Tell a health care provider about:  Any allergies you have.  All medicines you are taking, including vitamins, herbs, eye drops, creams, and over-the-counter medicines.  Any blood disorders you have.  Any surgeries you have had.  Any medical conditions you have.  Whether you are pregnant or may be pregnant. What are the risks? Generally, this is a safe procedure. However, problems may occur, including:  Allergic reaction to dye (contrast) that may be used during the procedure. What happens before the procedure? No specific preparation is needed. You may eat and drink normally. What happens during the procedure?    An IV tube may be inserted into one of your veins.  You may receive contrast through this tube. A contrast is an injection that improves the quality of the pictures from your heart.  A gel will be applied to your chest.  A wand-like tool (transducer) will be moved over your chest. The gel will help to transmit the sound waves from the transducer.  The sound waves will harmlessly bounce off of your heart to allow the heart images to be captured in real-time motion. The images will be recorded on a computer. The procedure may vary among health care providers and hospitals. What happens after the procedure?  You may return to your normal, everyday life, including diet, activities, and medicines, unless your health care provider tells you not to do that. Summary  An echocardiogram is a procedure that uses painless sound waves (ultrasound) to produce an image of the heart.  Images from an echocardiogram can provide important information about the size and shape of your heart, heart muscle function, heart valve function, and fluid buildup around  your heart.  You do not need to do anything to prepare before this procedure. You may eat and drink normally.  After the echocardiogram is completed, you may return to your normal, everyday life, unless your health care provider tells you not to do that. This information is not intended to replace advice given to you by your health care provider. Make sure you discuss any questions you have with your health care provider. Document Revised: 05/24/2018 Document Reviewed: 03/05/2016 Elsevier Patient Education  [place].

## 2019-12-03 NOTE — Progress Notes (Signed)
Cardiology Office Note:    Date:  12/03/2019   ID:  Latasha Williams, DOB 1940-10-10, MRN [id number]  PCP:  Ocie Doyne., MD  Cardiologist:  No primary care provider on file.  Electrophysiologist:  None   Referring MD: Jeanie Sewer, NP   " I have been experiencing heart fluttering"  History of Present Illness:    Latasha Williams is a 79 y.o. female with a hx of hypertension, restless leg syndrome presents today to be evaluated for palpitations.  The patient tells me that she has been experiencing significant abrupt heartbeat for a long time which she described as a fluttering sensation.  She notes that this is bothersome.  She does get shortness of breath during this time when she feels her palpitations.  She denies any chest pain.  Past Medical History:  Diagnosis Date  . Arthritis   . GERD (gastroesophageal reflux disease)   . History of skin cancer    BASAL CELL  . Hypertension   . Restless leg syndrome    OCCASIONAL PROBLEM ONLY    Past Surgical History:  Procedure Laterality Date  . BREAST CYST EXCISION     bilateral  benign  . CHOLECYSTECTOMY  2002  . DILATION AND CURETTAGE OF UTERUS  2013  . TOTAL KNEE ARTHROPLASTY Right 05/21/2012   Procedure: TOTAL KNEE ARTHROPLASTY;  Surgeon: Gearlean Alf, MD;  Location: [facility];  Service: Orthopedics;  Laterality: Right;    Current Medications: Current Meds  Medication Sig  . acetaminophen (TYLENOL) 325 MG tablet Take 2 tablets (650 mg total) by mouth every 6 (six) hours as needed.  Marland Kitchen aspirin 81 MG chewable tablet Chew 81 mg by mouth daily.  Marland Kitchen atenolol-chlorthalidone (TENORETIC) 50-25 MG per tablet Take 0.5 tablets by mouth daily before breakfast.   . bisacodyl (DULCOLAX) 10 MG suppository Place 1 suppository (10 mg total) rectally daily as needed.  . cephALEXin (KEFLEX) 250 MG capsule Take 250 mg by mouth daily.  . chlorpheniramine (CHLOR-TRIMETON) 4 MG tablet Take 4 mg by mouth 2 (two) times daily as needed for  allergies.  . ciprofloxacin (CIPRO) 250 MG tablet Take 250 mg by mouth.  . diphenhydrAMINE (BENADRYL) 12.5 MG/5ML elixir Take 5-10 mLs (12.5-25 mg total) by mouth every 4 (four) hours as needed for itching.  . docusate sodium 100 MG CAPS Take 100 mg by mouth 2 (two) times daily.  . Folic Acid-Cholecalciferol 1-500 MG-UNIT TABS Take 1 mg by mouth daily.  . methocarbamol (ROBAXIN) 500 MG tablet Take 1 tablet (500 mg total) by mouth every 6 (six) hours as needed.  . Multiple Vitamins-Minerals (B COMPLEX-C-E-ZINC) tablet Take by mouth.  Marland Kitchen omeprazole (PRILOSEC) 20 MG capsule Take 20 mg by mouth daily.  . ondansetron (ZOFRAN) 4 MG tablet Take 1 tablet (4 mg total) by mouth every 6 (six) hours as needed for nausea.  . ondansetron (ZOFRAN-ODT) 4 MG disintegrating tablet Take 4 mg by mouth as needed.  . potassium chloride (KLOR-CON) 10 MEQ tablet Take 10 mEq by mouth daily.  . pramipexole (MIRAPEX) 0.5 MG tablet Take 1 tablet by mouth at bedtime.  . torsemide (DEMADEX) 10 MG tablet Take 10 mg by mouth daily.  . traMADol (ULTRAM) 50 MG tablet Take 1-2 tablets (50-100 mg total) by mouth every 6 (six) hours as needed (mild pain).     Allergies:   Patient has no known allergies.   Social History   Socioeconomic History  . Marital status: Unknown    Spouse name:  Not on file  . Number of children: Not on file  . Years of education: Not on file  . Highest education level: Not on file  Occupational History  . Not on file  Tobacco Use  . Smoking status: Never Smoker  . Smokeless tobacco: Never Used  Substance and Sexual Activity  . Alcohol use: No  . Drug use: No  . Sexual activity: Not Currently  Other Topics Concern  . Not on file  Social History Narrative  . Not on file   Social Determinants of Health   Financial Resource Strain:   . Difficulty of Paying Living Expenses: Not on file  Food Insecurity:   . Worried About Charity fundraiser in the Last Year: Not on file  . Ran Out of Food  in the Last Year: Not on file  Transportation Needs:   . Lack of Transportation (Medical): Not on file  . Lack of Transportation (Non-Medical): Not on file  Physical Activity:   . Days of Exercise per Week: Not on file  . Minutes of Exercise per Session: Not on file  Stress:   . Feeling of Stress : Not on file  Social Connections:   . Frequency of Communication with Friends and Family: Not on file  . Frequency of Social Gatherings with Friends and Family: Not on file  . Attends Religious Services: Not on file  . Active Member of Clubs or Organizations: Not on file  . Attends Archivist Meetings: Not on file  . Marital Status: Not on file     Family History: The patient's family history includes Colon polyps in her sister; Congestive Heart Failure in her mother.  ROS:   Review of Systems  Constitution: Negative for decreased appetite, fever and weight gain.  HENT: Negative for congestion, ear discharge, hoarse voice and sore throat.   Eyes: Negative for discharge, redness, vision loss in right eye and visual halos.  Cardiovascular: Reports palpitations.  Negative for chest pain, dyspnea on exertion, leg swelling, orthopnea  Respiratory: Negative for cough, hemoptysis, shortness of breath and snoring.   Endocrine: Negative for heat intolerance and polyphagia.  Hematologic/Lymphatic: Negative for bleeding problem. Does not bruise/bleed easily.  Skin: Negative for flushing, nail changes, rash and suspicious lesions.  Musculoskeletal: Negative for arthritis, joint pain, muscle cramps, myalgias, neck pain and stiffness.  Gastrointestinal: Negative for abdominal pain, bowel incontinence, diarrhea and excessive appetite.  Genitourinary: Negative for decreased libido, genital sores and incomplete emptying.  Neurological: Negative for brief paralysis, focal weakness, headaches and loss of balance.  Psychiatric/Behavioral: Negative for altered mental status, depression and suicidal  ideas.  Allergic/Immunologic: Negative for HIV exposure and persistent infections.    EKGs/Labs/Other Studies Reviewed:    The following studies were reviewed today:   EKG:  The ekg ordered today demonstrates sinus rhythm, heart rate 67 bpm with premature atrial complexes.  Recent Labs: No results found for requested labs within last 8760 hours.  Recent Lipid Panel No results found for: CHOL, TRIG, HDL, CHOLHDL, VLDL, LDLCALC, LDLDIRECT  Physical Exam:    VS:  BP 104/86 (BP Location: Right Arm)   Pulse 67   Ht 5\' 5"  (1.651 m)   Wt 209 lb 6.4 oz (95 kg)   SpO2 97%   BMI 34.85 kg/m     Wt Readings from Last 3 Encounters:  12/03/19 209 lb 6.4 oz (95 kg)  05/21/12 202 lb (91.6 kg)  05/14/12 202 lb (91.6 kg)     GEN:  Well nourished, well developed in no acute distress HEENT: Normal NECK: No JVD; No carotid bruits LYMPHATICS: No lymphadenopathy CARDIAC: S1S2 noted,RRR, no murmurs, rubs, gallops RESPIRATORY:  Clear to auscultation without rales, wheezing or rhonchi  ABDOMEN: Soft, non-tender, non-distended, +bowel sounds, no guarding. EXTREMITIES: No edema, No cyanosis, no clubbing MUSCULOSKELETAL:  No deformity  SKIN: Warm and dry NEUROLOGIC:  Alert and oriented x 3, non-focal PSYCHIATRIC:  Normal affect, good insight  ASSESSMENT:    1. Palpitations   2. Murmur, cardiac   3. Shortness of breath   4. Primary hypertension    PLAN:    I would like to rule out a cardiovascular etiology of this palpitation, therefore at this time I would like to placed a zio patch for   14  days. In additon a transthoracic echocardiogram will be ordered to assess LV/RV function and any structural abnormalities. Once these testing have been performed amd reviewed further reccomendations will be made. For now, I do reccomend that the patient goes to the nearest ED if  symptoms recur.  Physical exam does suggest mid ejection systolic murmur I like to also get an echocardiogram to to rule out  any structural abnormalities.  PACs noted on EKG.  I am hoping to get her PAC burden on the monitor as well. The patient understands the need to lose weight with diet and exercise. We have discussed specific strategies for this.  Her blood pressure was acceptable in the office no changes will be made to her antihypertensive regimen.  The patient is in agreement with the above plan. The patient left the office in stable condition.  The patient will follow up in 8 weeks or sooner if needed.  Medication Adjustments/Labs and Tests Ordered: Current medicines are reviewed at length with the patient today.  Concerns regarding medicines are outlined above.  Orders Placed This Encounter  Procedures  . CBC with Differential/Platelet  . TSH  . LONG TERM MONITOR (3-14 DAYS)  . ECHOCARDIOGRAM COMPLETE   No orders of the defined types were placed in this encounter.   Patient Instructions  Medication Instructions:  No medication changes. *If you need a refill on your cardiac medications before your next appointment, please call your pharmacy*   Lab Work: None ordered If you have labs (blood work) drawn today and your tests are completely normal, you will receive your results only by: Marland Kitchen MyChart Message (if you have MyChart) OR . A paper copy in the mail If you have any lab test that is abnormal or we need to change your treatment, we will call you to review the results.   Testing/Procedures: Your physician has requested that you have an echocardiogram. Echocardiography is a painless test that uses sound waves to create images of your heart. It provides your doctor with information about the size and shape of your heart and how well your heart's chambers and valves are working. This procedure takes approximately one hour. There are no restrictions for this procedure.   WHY IS MY DOCTOR PRESCRIBING ZIO? The Zio system is proven and trusted by physicians to detect and diagnose irregular heart  rhythms -- and has been prescribed to hundreds of thousands of patients.  The FDA has cleared the Zio system to monitor for many different kinds of irregular heart rhythms. In a study, physicians were able to reach a diagnosis 90% of the time with the Zio system1.  You can wear the Zio monitor -- a small, discreet, comfortable patch -- during your  normal day-to-day activity, including while you sleep, shower, and exercise, while it records every single heartbeat for analysis.  1Barrett, P., et al. Comparison of 24 Hour Holter Monitoring Versus 14 Day Novel Adhesive Patch Electrocardiographic Monitoring. [place], 2014.  ZIO VS. HOLTER MONITORING The Zio monitor can be comfortably worn for up to 14 days. Holter monitors can be worn for 24 to 48 hours, limiting the time to record any irregular heart rhythms you may have. Zio is able to capture data for the 51% of patients who have their first symptom-triggered arrhythmia after 48 hours.1  LIVE WITHOUT RESTRICTIONS The Zio ambulatory cardiac monitor is a small, unobtrusive, and water-resistant patch--you might even forget you're wearing it. The Zio monitor records and stores every beat of your heart, whether you're sleeping, working out, or showering.  Wear the monitor for 14 days, remove 12/17/19.   Follow-Up: At [facility], you and your health needs are our priority.  As part of our continuing mission to provide you with exceptional heart care, we have created designated Provider Care Teams.  These Care Teams include your primary Cardiologist (physician) and Advanced Practice Providers (APPs -  Physician Assistants and Nurse Practitioners) who all work together to provide you with the care you need, when you need it.  We recommend signing up for the patient portal called "MyChart".  Sign up information is provided on this After Visit Summary.  MyChart is used to connect with patients for Virtual Visits (Telemedicine).   Patients are able to view lab/test results, encounter notes, upcoming appointments, etc.  Non-urgent messages can be sent to your provider as well.   To learn more about what you can do with MyChart, go to [email or web address].    Your next appointment:   8 week(s)  The format for your next appointment:   In Person  Provider:   Berniece Salines, DO   Other Instructions  Echocardiogram An echocardiogram is a procedure that uses painless sound waves (ultrasound) to produce an image of the heart. Images from an echocardiogram can provide important information about:  Signs of coronary artery disease (CAD).  Aneurysm detection. An aneurysm is a weak or damaged part of an artery wall that bulges out from the normal force of blood pumping through the body.  Heart size and shape. Changes in the size or shape of the heart can be associated with certain conditions, including heart failure, aneurysm, and CAD.  Heart muscle function.  Heart valve function.  Signs of a past heart attack.  Fluid buildup around the heart.  Thickening of the heart muscle.  A tumor or infectious growth around the heart valves. Tell a health care provider about:  Any allergies you have.  All medicines you are taking, including vitamins, herbs, eye drops, creams, and over-the-counter medicines.  Any blood disorders you have.  Any surgeries you have had.  Any medical conditions you have.  Whether you are pregnant or may be pregnant. What are the risks? Generally, this is a safe procedure. However, problems may occur, including:  Allergic reaction to dye (contrast) that may be used during the procedure. What happens before the procedure? No specific preparation is needed. You may eat and drink normally. What happens during the procedure?    An IV tube may be inserted into one of your veins.  You may receive contrast through this tube. A contrast is an injection that improves the quality of the  pictures from your heart.  A gel will be  applied to your chest.  A wand-like tool (transducer) will be moved over your chest. The gel will help to transmit the sound waves from the transducer.  The sound waves will harmlessly bounce off of your heart to allow the heart images to be captured in real-time motion. The images will be recorded on a computer. The procedure may vary among health care providers and hospitals. What happens after the procedure?  You may return to your normal, everyday life, including diet, activities, and medicines, unless your health care provider tells you not to do that. Summary  An echocardiogram is a procedure that uses painless sound waves (ultrasound) to produce an image of the heart.  Images from an echocardiogram can provide important information about the size and shape of your heart, heart muscle function, heart valve function, and fluid buildup around your heart.  You do not need to do anything to prepare before this procedure. You may eat and drink normally.  After the echocardiogram is completed, you may return to your normal, everyday life, unless your health care provider tells you not to do that. This information is not intended to replace advice given to you by your health care provider. Make sure you discuss any questions you have with your health care provider. Document Revised: 05/24/2018 Document Reviewed: 03/05/2016 Elsevier Patient Education  [place].     Adopting a Healthy Lifestyle.  Know what a healthy weight is for you (roughly BMI <25) and aim to maintain this   Aim for 7+ servings of fruits and vegetables daily   65-80+ fluid ounces of water or unsweet tea for healthy kidneys   Limit to max 1 drink of alcohol per day; avoid smoking/tobacco   Limit animal fats in diet for cholesterol and heart health - choose grass fed whenever available   Avoid highly processed foods, and foods high in saturated/trans fats   Aim  for low stress - take time to unwind and care for your mental health   Aim for 150 min of moderate intensity exercise weekly for heart health, and weights twice weekly for bone health   Aim for 7-9 hours of sleep daily   When it comes to diets, agreement about the perfect plan isnt easy to find, even among the experts. Experts at the [place] developed an idea known as the Healthy Eating Plate. Just imagine a plate divided into logical, healthy portions.   The emphasis is on diet quality:   Load up on vegetables and fruits - one-half of your plate: Aim for color and variety, and remember that potatoes dont count.   Go for whole grains - one-quarter of your plate: Whole wheat, barley, wheat berries, quinoa, oats, brown rice, and foods made with them. If you want pasta, go with whole wheat pasta.   Protein power - one-quarter of your plate: Fish, chicken, beans, and nuts are all healthy, versatile protein sources. Limit red meat.   The diet, however, does go beyond the plate, offering a few other suggestions.   Use healthy plant oils, such as olive, canola, soy, corn, sunflower and peanut. Check the labels, and avoid partially hydrogenated oil, which have unhealthy trans fats.   If youre thirsty, drink water. Coffee and tea are good in moderation, but skip sugary drinks and limit milk and dairy products to one or two daily servings.   The type of carbohydrate in the diet is more important than the amount. Some sources of carbohydrates, such  as vegetables, fruits, whole grains, and beans-are healthier than others.   Finally, stay active  Signed, Berniece Salines, DO  12/03/2019 2:07 PM    [place]

## 2019-12-04 ENCOUNTER — Telehealth: Payer: Self-pay

## 2019-12-04 LAB — CBC WITH DIFFERENTIAL/PLATELET
Basophils Absolute: 0.1 10*3/uL (ref 0.0–0.2)
Basos: 1 %
EOS (ABSOLUTE): 0.3 10*3/uL (ref 0.0–0.4)
Eos: 3 %
Hematocrit: 42.8 % (ref 34.0–46.6)
Hemoglobin: 14.5 g/dL (ref 11.1–15.9)
Immature Grans (Abs): 0 10*3/uL (ref 0.0–0.1)
Immature Granulocytes: 0 %
Lymphocytes Absolute: 4.1 10*3/uL — ABNORMAL HIGH (ref 0.7–3.1)
Lymphs: 40 %
MCH: 30.1 pg (ref 26.6–33.0)
MCHC: 33.9 g/dL (ref 31.5–35.7)
MCV: 89 fL (ref 79–97)
Monocytes Absolute: 0.9 10*3/uL (ref 0.1–0.9)
Monocytes: 8 %
Neutrophils Absolute: 5.1 10*3/uL (ref 1.4–7.0)
Neutrophils: 48 %
Platelets: 265 10*3/uL (ref 150–450)
RBC: 4.82 x10E6/uL (ref 3.77–5.28)
RDW: 12.7 % (ref 11.7–15.4)
WBC: 10.5 10*3/uL (ref 3.4–10.8)

## 2019-12-04 LAB — TSH: TSH: 2.15 u[IU]/mL (ref 0.450–4.500)

## 2019-12-04 NOTE — Telephone Encounter (Signed)
-----   Message from Berniece Salines, DO sent at 12/04/2019  9:36 AM EDT -----  Overall normal labs.

## 2019-12-04 NOTE — Telephone Encounter (Signed)
Spoke with patient regarding results and recommendation.  Patient verbalizes understanding and is agreeable to plan of care. Advised patient to call back with any issues or concerns.  

## 2019-12-23 ENCOUNTER — Ambulatory Visit (INDEPENDENT_AMBULATORY_CARE_PROVIDER_SITE_OTHER): Payer: PPO

## 2019-12-23 ENCOUNTER — Other Ambulatory Visit: Payer: Self-pay

## 2019-12-23 DIAGNOSIS — R011 Cardiac murmur, unspecified: Secondary | ICD-10-CM | POA: Diagnosis not present

## 2019-12-23 DIAGNOSIS — R002 Palpitations: Secondary | ICD-10-CM | POA: Diagnosis not present

## 2019-12-23 DIAGNOSIS — R0602 Shortness of breath: Secondary | ICD-10-CM

## 2019-12-23 LAB — ECHOCARDIOGRAM COMPLETE
Area-P 1/2: 3.91 cm2
S' Lateral: 3.3 cm

## 2019-12-23 NOTE — Progress Notes (Signed)
Complete echocardiogram has been performed.  Jimmy Arrick Dutton RDCS, RVT 

## 2019-12-24 DIAGNOSIS — M1990 Unspecified osteoarthritis, unspecified site: Secondary | ICD-10-CM | POA: Diagnosis not present

## 2019-12-24 DIAGNOSIS — G2581 Restless legs syndrome: Secondary | ICD-10-CM | POA: Diagnosis not present

## 2019-12-24 DIAGNOSIS — R739 Hyperglycemia, unspecified: Secondary | ICD-10-CM | POA: Diagnosis not present

## 2019-12-24 DIAGNOSIS — I50810 Right heart failure, unspecified: Secondary | ICD-10-CM | POA: Diagnosis not present

## 2019-12-24 DIAGNOSIS — N390 Urinary tract infection, site not specified: Secondary | ICD-10-CM | POA: Diagnosis not present

## 2019-12-24 DIAGNOSIS — E559 Vitamin D deficiency, unspecified: Secondary | ICD-10-CM | POA: Diagnosis not present

## 2019-12-24 DIAGNOSIS — Z6834 Body mass index (BMI) 34.0-34.9, adult: Secondary | ICD-10-CM | POA: Diagnosis not present

## 2019-12-25 DIAGNOSIS — R002 Palpitations: Secondary | ICD-10-CM | POA: Diagnosis not present

## 2019-12-30 ENCOUNTER — Ambulatory Visit: Payer: PPO | Admitting: Cardiology

## 2020-01-13 DIAGNOSIS — R059 Cough, unspecified: Secondary | ICD-10-CM | POA: Diagnosis not present

## 2020-01-13 DIAGNOSIS — J069 Acute upper respiratory infection, unspecified: Secondary | ICD-10-CM | POA: Diagnosis not present

## 2020-01-14 DIAGNOSIS — B349 Viral infection, unspecified: Secondary | ICD-10-CM | POA: Diagnosis not present

## 2020-01-14 DIAGNOSIS — J069 Acute upper respiratory infection, unspecified: Secondary | ICD-10-CM | POA: Diagnosis not present

## 2020-01-20 DIAGNOSIS — N3090 Cystitis, unspecified without hematuria: Secondary | ICD-10-CM | POA: Diagnosis not present

## 2020-01-20 DIAGNOSIS — N952 Postmenopausal atrophic vaginitis: Secondary | ICD-10-CM | POA: Diagnosis not present

## 2020-01-30 ENCOUNTER — Encounter: Payer: Self-pay | Admitting: Cardiology

## 2020-01-30 ENCOUNTER — Ambulatory Visit: Payer: PPO | Admitting: Cardiology

## 2020-01-30 ENCOUNTER — Other Ambulatory Visit: Payer: Self-pay

## 2020-01-30 VITALS — BP 122/80 | HR 62 | Ht 65.0 in | Wt 209.2 lb

## 2020-01-30 DIAGNOSIS — I480 Paroxysmal atrial fibrillation: Secondary | ICD-10-CM

## 2020-01-30 DIAGNOSIS — I10 Essential (primary) hypertension: Secondary | ICD-10-CM | POA: Diagnosis not present

## 2020-01-30 DIAGNOSIS — I491 Atrial premature depolarization: Secondary | ICD-10-CM | POA: Diagnosis not present

## 2020-01-30 DIAGNOSIS — I471 Supraventricular tachycardia: Secondary | ICD-10-CM | POA: Diagnosis not present

## 2020-01-30 NOTE — Progress Notes (Signed)
Cardiology Office Note:    Date:  01/31/2020   ID:  Latasha Williams, DOB 1940-07-05, MRN [id number]  PCP:  Penelope Coop, FNP  Cardiologist:  Berniece Salines, DO  Electrophysiologist:  None   Referring MD: Ocie Doyne., MD   " I am fine   History of Present Illness:    Latasha Williams is a 79 y.o. female with a hx of hypertension, restless leg syndrome is here today for follow-up visit.  Did see the patient back in October 2021 at that time she reported she has some palpitation per the monitor and patient she is here today to discuss her monitor results.   Past Medical History:  Diagnosis Date  . Arthritis   . GERD (gastroesophageal reflux disease)   . History of skin cancer    BASAL CELL  . Hypertension   . Restless leg syndrome    OCCASIONAL PROBLEM ONLY    Past Surgical History:  Procedure Laterality Date  . BREAST CYST EXCISION     bilateral  benign  . CHOLECYSTECTOMY  2002  . DILATION AND CURETTAGE OF UTERUS  2013  . TOTAL KNEE ARTHROPLASTY Right 05/21/2012   Procedure: TOTAL KNEE ARTHROPLASTY;  Surgeon: Gearlean Alf, MD;  Location: [facility];  Service: Orthopedics;  Laterality: Right;    Current Medications: Current Meds  Medication Sig  . acetaminophen (TYLENOL) 325 MG tablet Take 2 tablets (650 mg total) by mouth every 6 (six) hours as needed.  Marland Kitchen aspirin 81 MG chewable tablet Chew 81 mg by mouth daily.  Marland Kitchen atenolol-chlorthalidone (TENORETIC) 50-25 MG per tablet Take 0.5 tablets by mouth daily before breakfast.   . bisacodyl (DULCOLAX) 10 MG suppository Place 1 suppository (10 mg total) rectally daily as needed.  . cephALEXin (KEFLEX) 250 MG capsule Take 250 mg by mouth daily.  . chlorpheniramine (CHLOR-TRIMETON) 4 MG tablet Take 4 mg by mouth 2 (two) times daily as needed for allergies.  . ciprofloxacin (CIPRO) 250 MG tablet Take 250 mg by mouth.  . diphenhydrAMINE (BENADRYL) 12.5 MG/5ML elixir Take 5-10 mLs (12.5-25 mg total) by mouth every 4 (four) hours as  needed for itching.  . docusate sodium 100 MG CAPS Take 100 mg by mouth 2 (two) times daily.  . Folic Acid-Cholecalciferol 1-500 MG-UNIT TABS Take 1 mg by mouth daily.  . methocarbamol (ROBAXIN) 500 MG tablet Take 1 tablet (500 mg total) by mouth every 6 (six) hours as needed.  . Multiple Vitamins-Minerals (B COMPLEX-C-E-ZINC) tablet Take by mouth.  Marland Kitchen omeprazole (PRILOSEC) 20 MG capsule Take 20 mg by mouth daily.  . ondansetron (ZOFRAN) 4 MG tablet Take 1 tablet (4 mg total) by mouth every 6 (six) hours as needed for nausea.  . ondansetron (ZOFRAN-ODT) 4 MG disintegrating tablet Take 4 mg by mouth as needed.  . pramipexole (MIRAPEX) 0.5 MG tablet Take 1 tablet by mouth at bedtime.  . torsemide (DEMADEX) 10 MG tablet Take 10 mg by mouth daily.  . traMADol (ULTRAM) 50 MG tablet Take 1-2 tablets (50-100 mg total) by mouth every 6 (six) hours as needed (mild pain).  . Vitamin D, Ergocalciferol, (DRISDOL) 1.25 MG (50000 UNIT) CAPS capsule Take 50,000 Units by mouth once a week.  . [DISCONTINUED] potassium chloride (KLOR-CON) 10 MEQ tablet Take 10 mEq by mouth daily.     Allergies:   Patient has no known allergies.   Social History   Socioeconomic History  . Marital status: Unknown    Spouse name: Not on file  .  Number of children: Not on file  . Years of education: Not on file  . Highest education level: Not on file  Occupational History  . Not on file  Tobacco Use  . Smoking status: Never Smoker  . Smokeless tobacco: Never Used  Substance and Sexual Activity  . Alcohol use: No  . Drug use: No  . Sexual activity: Not Currently  Other Topics Concern  . Not on file  Social History Narrative  . Not on file   Social Determinants of Health   Financial Resource Strain: Not on file  Food Insecurity: Not on file  Transportation Needs: Not on file  Physical Activity: Not on file  Stress: Not on file  Social Connections: Not on file     Family History: The patient's family history  includes Colon polyps in her sister; Congestive Heart Failure in her mother.  ROS:   Review of Systems  Constitution: Negative for decreased appetite, fever and weight gain.  HENT: Negative for congestion, ear discharge, hoarse voice and sore throat.   Eyes: Negative for discharge, redness, vision loss in right eye and visual halos.  Cardiovascular: Negative for chest pain, dyspnea on exertion, leg swelling, orthopnea and palpitations.  Respiratory: Negative for cough, hemoptysis, shortness of breath and snoring.   Endocrine: Negative for heat intolerance and polyphagia.  Hematologic/Lymphatic: Negative for bleeding problem. Does not bruise/bleed easily.  Skin: Negative for flushing, nail changes, rash and suspicious lesions.  Musculoskeletal: Negative for arthritis, joint pain, muscle cramps, myalgias, neck pain and stiffness.  Gastrointestinal: Negative for abdominal pain, bowel incontinence, diarrhea and excessive appetite.  Genitourinary: Negative for decreased libido, genital sores and incomplete emptying.  Neurological: Negative for brief paralysis, focal weakness, headaches and loss of balance.  Psychiatric/Behavioral: Negative for altered mental status, depression and suicidal ideas.  Allergic/Immunologic: Negative for HIV exposure and persistent infections.    EKGs/Labs/Other Studies Reviewed:    The following studies were reviewed today:   EKG:  The ekg ordered today demonstrates   ZIO monitor The patient wore the monitor for 13 days and 7 hours starting December 03, 2019. Indication: Palpitations  The minimum heart rate was 40 bpm, maximum heart rate was 182 bpm, and average heart rate was 61 bpm. Predominant underlying rhythm was Sinus Rhythm.  178 Supraventricular Tachycardia runs occurred, the run with the fastest interval lasting 9 beats with a maximum rate of 182 bpm, the longest lasting 25.6 secs with an average rate of 118 bpm.   Atrial Fibrillation occurred  (1% burden), ranging from 62-143 bpm (avg of 91 bpm), the longest lasting 2 hours 19 minutes with an average rate of 88 bpm.  Premature atrial complexes were frequent (7.7%, 84950). Premature Ventricular complexes were less than 1%.  There is suspected artifact which has been noted of ventricular tachycardia but based on my review of the monitor this is premature ventricular couplet with artifact.  No pauses or no AV block present.  12 patient triggered events 2 associated with atrial fibrillation and the remaining with sinus rhythm.  13 diary events 1 associated with atrial fibrillation.  Conclusion: This study is remarkable for the following:                                           1.  Paroxysmal atrial fibrillation (1% burden).  2.  Supraventricular tachycardia which is likely atrial tachycardia with variable block.                                            3.  Frequent premature atrial complex (7.7%, 84950   Recent Labs: 12/03/2019: Hemoglobin 14.5; Platelets 265; TSH 2.150 01/30/2020: BUN 16; Creatinine, Ser 0.95; Magnesium 2.2; Potassium 3.1; Sodium 137  Recent Lipid Panel No results found for: CHOL, TRIG, HDL, CHOLHDL, VLDL, LDLCALC, LDLDIRECT  Physical Exam:    VS:  BP 122/80   Pulse 62   Ht 5\' 5"  (1.651 m)   Wt 209 lb 3.2 oz (94.9 kg)   SpO2 98%   BMI 34.81 kg/m     Wt Readings from Last 3 Encounters:  01/30/20 209 lb 3.2 oz (94.9 kg)  12/03/19 209 lb 6.4 oz (95 kg)  05/21/12 202 lb (91.6 kg)     GEN: Well nourished, well developed in no acute distress HEENT: Normal NECK: No JVD; No carotid bruits LYMPHATICS: No lymphadenopathy CARDIAC: S1S2 noted,RRR, no murmurs, rubs, gallops RESPIRATORY:  Clear to auscultation without rales, wheezing or rhonchi  ABDOMEN: Soft, non-tender, non-distended, +bowel sounds, no guarding. EXTREMITIES: No edema, No cyanosis, no clubbing MUSCULOSKELETAL:  No  deformity  SKIN: Warm and dry NEUROLOGIC:  Alert and oriented x 3, non-focal PSYCHIATRIC:  Normal affect, good insight  ASSESSMENT:    1. PAF (paroxysmal atrial fibrillation) ([place])   2. PAT (paroxysmal atrial tachycardia) ([place])   3. PAC (premature atrial contraction)   4. Primary hypertension    PLAN:     1.  Her blood pressure deceptively in the office today.  I discussed with the patient about her monitor results.  Explained to her that she did have some evidence of paroxysmal atrial fibrillation.  I explained to her what that means that she had 1 burden percent.  That she now have a diagnosis of atrial fibrillation.  I see this patient that we need rate and rhythm control as well as her CHA2DS2-VASc score was 4 which increase her risk of stroke that I would like to start her on anticoagulation for stroke prevention.  During his visit she adamantly refused and does not want any medications to help with rate control rhythm control or definitely not anticoagulation.  I asked the patient why she says she just on 1 to be on any anticoagulation she is on aspirin and she is happy with this.  Thankfully she is on atenolol and this has been placed prior for hypertension so we will going to continue this for now.  The patient is in agreement with the above plan. The patient left the office in stable condition.  The patient will follow up in 6 months or sooner if needed.   Medication Adjustments/Labs and Tests Ordered: Current medicines are reviewed at length with the patient today.  Concerns regarding medicines are outlined above.  Orders Placed This Encounter  Procedures  . Magnesium  . Basic metabolic panel   No orders of the defined types were placed in this encounter.   Patient Instructions  Medication Instructions:  No medication changes. *If you need a refill on your cardiac medications before your next appointment, please call your pharmacy*   Lab Work: None ordered If you have  labs (blood work) drawn today and your tests are completely normal, you will receive your results only  by: . MyChart Message (if you have MyChart) OR . A paper copy in the mail If you have any lab test that is abnormal or we need to change your treatment, we will call you to review the results.   Testing/Procedures: Your physician recommends that you have labs done in the office today. Your test included  basic metabolic panel and magnesium.    Follow-Up: At [facility], you and your health needs are our priority.  As part of our continuing mission to provide you with exceptional heart care, we have created designated Provider Care Teams.  These Care Teams include your primary Cardiologist (physician) and Advanced Practice Providers (APPs -  Physician Assistants and Nurse Practitioners) who all work together to provide you with the care you need, when you need it.  We recommend signing up for the patient portal called "MyChart".  Sign up information is provided on this After Visit Summary.  MyChart is used to connect with patients for Virtual Visits (Telemedicine).  Patients are able to view lab/test results, encounter notes, upcoming appointments, etc.  Non-urgent messages can be sent to your provider as well.   To learn more about what you can do with MyChart, go to [email or web address].    Your next appointment:   6 month(s)  The format for your next appointment:   In Person  Provider:   Berniece Salines, DO   Other Instructions NA     Adopting a Healthy Lifestyle.  Know what a healthy weight is for you (roughly BMI <25) and aim to maintain this   Aim for 7+ servings of fruits and vegetables daily   65-80+ fluid ounces of water or unsweet tea for healthy kidneys   Limit to max 1 drink of alcohol per day; avoid smoking/tobacco   Limit animal fats in diet for cholesterol and heart health - choose grass fed whenever available   Avoid highly processed foods, and foods high  in saturated/trans fats   Aim for low stress - take time to unwind and care for your mental health   Aim for 150 min of moderate intensity exercise weekly for heart health, and weights twice weekly for bone health   Aim for 7-9 hours of sleep daily   When it comes to diets, agreement about the perfect plan isnt easy to find, even among the experts. Experts at the [place] developed an idea known as the Healthy Eating Plate. Just imagine a plate divided into logical, healthy portions.   The emphasis is on diet quality:   Load up on vegetables and fruits - one-half of your plate: Aim for color and variety, and remember that potatoes dont count.   Go for whole grains - one-quarter of your plate: Whole wheat, barley, wheat berries, quinoa, oats, brown rice, and foods made with them. If you want pasta, go with whole wheat pasta.   Protein power - one-quarter of your plate: Fish, chicken, beans, and nuts are all healthy, versatile protein sources. Limit red meat.   The diet, however, does go beyond the plate, offering a few other suggestions.   Use healthy plant oils, such as olive, canola, soy, corn, sunflower and peanut. Check the labels, and avoid partially hydrogenated oil, which have unhealthy trans fats.   If youre thirsty, drink water. Coffee and tea are good in moderation, but skip sugary drinks and limit milk and dairy products to one or two daily servings.   The type of carbohydrate in the diet  is more important than the amount. Some sources of carbohydrates, such as vegetables, fruits, whole grains, and beans-are healthier than others.   Finally, stay active  Signed, Berniece Salines, DO  01/31/2020 12:23 PM    [facility]

## 2020-01-30 NOTE — Patient Instructions (Signed)
Medication Instructions:  No medication changes. *If you need a refill on your cardiac medications before your next appointment, please call your pharmacy*   Lab Work: None ordered If you have labs (blood work) drawn today and your tests are completely normal, you will receive your results only by: Marland Kitchen MyChart Message (if you have MyChart) OR . A paper copy in the mail If you have any lab test that is abnormal or we need to change your treatment, we will call you to review the results.   Testing/Procedures: Your physician recommends that you have labs done in the office today. Your test included  basic metabolic panel and magnesium.    Follow-Up: At [facility], you and your health needs are our priority.  As part of our continuing mission to provide you with exceptional heart care, we have created designated Provider Care Teams.  These Care Teams include your primary Cardiologist (physician) and Advanced Practice Providers (APPs -  Physician Assistants and Nurse Practitioners) who all work together to provide you with the care you need, when you need it.  We recommend signing up for the patient portal called "MyChart".  Sign up information is provided on this After Visit Summary.  MyChart is used to connect with patients for Virtual Visits (Telemedicine).  Patients are able to view lab/test results, encounter notes, upcoming appointments, etc.  Non-urgent messages can be sent to your provider as well.   To learn more about what you can do with MyChart, go to [email or web address].    Your next appointment:   6 month(s)  The format for your next appointment:   In Person  Provider:   Berniece Salines, DO   Other Instructions NA

## 2020-01-31 ENCOUNTER — Telehealth: Payer: Self-pay

## 2020-01-31 ENCOUNTER — Telehealth: Payer: Self-pay | Admitting: Cardiology

## 2020-01-31 DIAGNOSIS — I480 Paroxysmal atrial fibrillation: Secondary | ICD-10-CM | POA: Insufficient documentation

## 2020-01-31 DIAGNOSIS — I491 Atrial premature depolarization: Secondary | ICD-10-CM | POA: Insufficient documentation

## 2020-01-31 DIAGNOSIS — E876 Hypokalemia: Secondary | ICD-10-CM

## 2020-01-31 DIAGNOSIS — I471 Supraventricular tachycardia: Secondary | ICD-10-CM | POA: Insufficient documentation

## 2020-01-31 LAB — BASIC METABOLIC PANEL
BUN/Creatinine Ratio: 17 (ref 12–28)
BUN: 16 mg/dL (ref 8–27)
CO2: 27 mmol/L (ref 20–29)
Calcium: 9.3 mg/dL (ref 8.7–10.3)
Chloride: 93 mmol/L — ABNORMAL LOW (ref 96–106)
Creatinine, Ser: 0.95 mg/dL (ref 0.57–1.00)
GFR calc Af Amer: 65 mL/min/{1.73_m2} (ref >59)
GFR calc non Af Amer: 56 mL/min/{1.73_m2} — ABNORMAL LOW (ref >59)
Glucose: 106 mg/dL — ABNORMAL HIGH (ref 65–99)
Potassium: 3.1 mmol/L — ABNORMAL LOW (ref 3.5–5.2)
Sodium: 137 mmol/L (ref 134–144)

## 2020-01-31 LAB — MAGNESIUM: Magnesium: 2.2 mg/dL (ref 1.6–2.3)

## 2020-01-31 MED ORDER — POTASSIUM CHLORIDE CRYS ER 20 MEQ PO TBCR: 20.0000 meq | EXTENDED_RELEASE_TABLET | Freq: Every day | ORAL | 3 refills | Status: DC

## 2020-01-31 MED ORDER — APIXABAN 5 MG PO TABS: 5.0000 mg | ORAL_TABLET | Freq: Two times a day (BID) | ORAL | 11 refills | Status: DC

## 2020-01-31 MED ORDER — APIXABAN 5 MG PO TABS: 5.0000 mg | ORAL_TABLET | Freq: Two times a day (BID) | ORAL | 3 refills | Status: DC

## 2020-01-31 NOTE — Telephone Encounter (Signed)
Returned the call to the patient. She stated that she had a visit with Dr. Harriet Masson and it was advised to start Eliquis. The patient stated that she spoke with her children and they want her to start this. She would like this called in for her

## 2020-01-31 NOTE — Telephone Encounter (Signed)
Yes please

## 2020-01-31 NOTE — Telephone Encounter (Signed)
-----   Message from Berniece Salines, DO sent at 01/31/2020  9:59 AM EST ----- Please increase potassium supplement to 20 mEq daily.  Have blood work repeated in 1 week

## 2020-01-31 NOTE — Telephone Encounter (Signed)
 *  STAT* If patient is at the pharmacy, call can be transferred to refill team.   1. Which medications need to be refilled? (please list name of each medication and dose if known) Eliquis   2. Which pharmacy/location (including street and city if local pharmacy) is medication to be sent to? [facility], [place]  3. Do they need a 30 day or 90 day supply? 30 with refills     Patient said she spoke with her family yesterday and has decided to start taking the Eliquis. The patient would like Dr. Harriet Masson to call an rx to her pharmacy

## 2020-01-31 NOTE — Telephone Encounter (Signed)
Spoke with patient regarding results and recommendation.  Patient verbalizes understanding and is agreeable to plan of care. Advised patient to call back with any issues or concerns.  

## 2020-01-31 NOTE — Telephone Encounter (Signed)
Please start the patient on Eliquis 5 mg twice a day.

## 2020-01-31 NOTE — Addendum Note (Signed)
Addended by: Ricci Barker on: 01/31/2020 01:29 PM   Modules accepted: Orders

## 2020-01-31 NOTE — Telephone Encounter (Signed)
Spoke with the patient. Eliquis 5 mg twice daily has been sent in for the patient. She has also been advised to stop the aspirin.

## 2020-01-31 NOTE — Telephone Encounter (Signed)
*  STAT* If patient is at the pharmacy, call can be transferred to refill team.   1. Which medications need to be refilled? (please list name of each medication and dose if known)  apixaban (ELIQUIS) 5 MG TABS tablet  2. Which pharmacy/location (including street and city if local pharmacy) is medication to be sent to? [place], [place] Phone:  [phone]  Fax:  [phone]    3. Do they need a 30 day or 90 day supply? 90   Patient states she wants 90 day supplies instead of 30. Please advise.

## 2020-01-31 NOTE — Telephone Encounter (Signed)
90 day supply sent in per request

## 2020-02-04 ENCOUNTER — Telehealth: Payer: Self-pay | Admitting: Cardiology

## 2020-02-04 MED ORDER — APIXABAN 5 MG PO TABS: 5.0000 mg | ORAL_TABLET | Freq: Two times a day (BID) | ORAL | 3 refills | Status: DC

## 2020-02-04 NOTE — Telephone Encounter (Signed)
Pt aware that we will fax the request to her insurance. I also talked with the patient about patient assistance. I have placed the forms for her to pick up at the front office. Pt verbalized understanding and had no additional questions.

## 2020-02-04 NOTE — Telephone Encounter (Signed)
    Pt c/o medication issue:  1. Name of Medication:   apixaban (ELIQUIS) 5 MG TABS tablet    2. How are you currently taking this medication (dosage and times per day)? Take 1 tablet (5 mg total) by mouth 2 (two) times daily.  3. Are you having a reaction (difficulty breathing--STAT)?   4. What is your medication issue? Pt would like to fax this prescription to her insurance so next month they will refill id for her fax # [phone] [employer]

## 2020-02-04 NOTE — Telephone Encounter (Signed)
Rx sign and faxed.

## 2020-02-17 ENCOUNTER — Telehealth: Payer: Self-pay | Admitting: Cardiology

## 2020-02-17 NOTE — Telephone Encounter (Signed)
Spoke to the patient just now and she had a question in regards to her patient assistant paperwork. I let her know that the prescriber application will be filled out by Dr. Servando Salina. She verbalizes understanding and thanks me for the call back.

## 2020-02-17 NOTE — Telephone Encounter (Signed)
  Patient would like to speak to the nurse regarding paperwork for auth for Eliquis

## 2020-02-18 NOTE — Telephone Encounter (Signed)
Paper work faxed successfully for Eliquis, called and let patient know.  She thanked me for the call.

## 2020-02-20 ENCOUNTER — Other Ambulatory Visit: Payer: Self-pay

## 2020-02-20 DIAGNOSIS — G2581 Restless legs syndrome: Secondary | ICD-10-CM | POA: Insufficient documentation

## 2020-02-20 DIAGNOSIS — M1990 Unspecified osteoarthritis, unspecified site: Secondary | ICD-10-CM | POA: Insufficient documentation

## 2020-02-20 DIAGNOSIS — Z85828 Personal history of other malignant neoplasm of skin: Secondary | ICD-10-CM | POA: Insufficient documentation

## 2020-02-20 DIAGNOSIS — I10 Essential (primary) hypertension: Secondary | ICD-10-CM | POA: Insufficient documentation

## 2020-02-20 DIAGNOSIS — K219 Gastro-esophageal reflux disease without esophagitis: Secondary | ICD-10-CM | POA: Insufficient documentation

## 2020-02-24 ENCOUNTER — Ambulatory Visit: Payer: PPO | Admitting: Cardiology

## 2020-02-24 ENCOUNTER — Other Ambulatory Visit: Payer: Self-pay

## 2020-02-24 ENCOUNTER — Encounter: Payer: Self-pay | Admitting: Cardiology

## 2020-02-24 VITALS — BP 118/62 | HR 65 | Ht 65.0 in | Wt 211.8 lb

## 2020-02-24 DIAGNOSIS — I10 Essential (primary) hypertension: Secondary | ICD-10-CM | POA: Diagnosis not present

## 2020-02-24 DIAGNOSIS — I471 Supraventricular tachycardia: Secondary | ICD-10-CM

## 2020-02-24 DIAGNOSIS — I480 Paroxysmal atrial fibrillation: Secondary | ICD-10-CM | POA: Diagnosis not present

## 2020-02-24 DIAGNOSIS — I491 Atrial premature depolarization: Secondary | ICD-10-CM

## 2020-02-24 NOTE — Progress Notes (Signed)
Cardiology Office Note:    Date:  02/24/2020   ID:  Latasha Williams, DOB 1940-09-13, MRN [id number]  PCP:  Penelope Coop, FNP  Cardiologist:  Berniece Salines, DO  Electrophysiologist:  None   Referring MD: Penelope Coop, FNP     History of Present Illness:    Latasha Williams is a 80 y.o. female with a hx of hypertension, restless leg syndrome presents monitor fibrillation seen on her recent monitor now on Eliquis and atenolol presents today for follow-up visit.  Today she tells me that she has not had any palpitations.  We talked more about the fact that she is on Eliquis she had questions about bleeding.  I did educate the patient that yes Eliquis can increase her risk of bleeding.  No other complaints at this time.  Past Medical History:  Diagnosis Date  . Arthritis   . GERD (gastroesophageal reflux disease)   . History of skin cancer    BASAL CELL  . Hypertension   . Restless leg syndrome    OCCASIONAL PROBLEM ONLY    Past Surgical History:  Procedure Laterality Date  . BREAST CYST EXCISION     bilateral  benign  . CHOLECYSTECTOMY  2002  . DILATION AND CURETTAGE OF UTERUS  2013  . TOTAL KNEE ARTHROPLASTY Right 05/21/2012   Procedure: TOTAL KNEE ARTHROPLASTY;  Surgeon: Gearlean Alf, MD;  Location: [facility];  Service: Orthopedics;  Laterality: Right;    Current Medications: Current Meds  Medication Sig  . acetaminophen (TYLENOL) 325 MG tablet Take 2 tablets (650 mg total) by mouth every 6 (six) hours as needed.  Marland Kitchen apixaban (ELIQUIS) 5 MG TABS tablet Take 1 tablet (5 mg total) by mouth 2 (two) times daily.  Marland Kitchen atenolol-chlorthalidone (TENORETIC) 50-25 MG per tablet Take 0.5 tablets by mouth daily before breakfast.   . benzonatate (TESSALON) 200 MG capsule Take 200 mg by mouth 3 (three) times daily as needed.  . bisacodyl (DULCOLAX) 10 MG suppository Place 1 suppository (10 mg total) rectally daily as needed.  . cephALEXin (KEFLEX) 250 MG capsule Take 250 mg by mouth daily.   . chlorpheniramine (CHLOR-TRIMETON) 4 MG tablet Take 4 mg by mouth 2 (two) times daily as needed for allergies.  . ciprofloxacin (CIPRO) 250 MG tablet Take 250 mg by mouth.  . diphenhydrAMINE (BENADRYL) 12.5 MG/5ML elixir Take 5-10 mLs (12.5-25 mg total) by mouth every 4 (four) hours as needed for itching.  . docusate sodium 100 MG CAPS Take 100 mg by mouth 2 (two) times daily.  . Folic Acid-Cholecalciferol 1-500 MG-UNIT TABS Take 1 mg by mouth daily.  . methocarbamol (ROBAXIN) 500 MG tablet Take 1 tablet (500 mg total) by mouth every 6 (six) hours as needed.  . Multiple Vitamins-Minerals (B COMPLEX-C-E-ZINC) tablet Take by mouth.  Marland Kitchen omeprazole (PRILOSEC) 20 MG capsule Take 20 mg by mouth daily.  . ondansetron (ZOFRAN) 4 MG tablet Take 1 tablet (4 mg total) by mouth every 6 (six) hours as needed for nausea.  . ondansetron (ZOFRAN-ODT) 4 MG disintegrating tablet Take 4 mg by mouth as needed.  . potassium chloride SA (KLOR-CON) 20 MEQ tablet Take 1 tablet (20 mEq total) by mouth daily.  . pramipexole (MIRAPEX) 0.5 MG tablet Take 1 tablet by mouth at bedtime.  . torsemide (DEMADEX) 20 MG tablet Take 20 mg by mouth daily.  . traMADol (ULTRAM) 50 MG tablet Take 1-2 tablets (50-100 mg total) by mouth every 6 (six) hours as needed (mild  pain).  . Vitamin D, Ergocalciferol, (DRISDOL) 1.25 MG (50000 UNIT) CAPS capsule Take 50,000 Units by mouth once a week.     Allergies:   Patient has no known allergies.   Social History   Socioeconomic History  . Marital status: Unknown    Spouse name: Not on file  . Number of children: Not on file  . Years of education: Not on file  . Highest education level: Not on file  Occupational History  . Not on file  Tobacco Use  . Smoking status: Never Smoker  . Smokeless tobacco: Never Used  Substance and Sexual Activity  . Alcohol use: No  . Drug use: No  . Sexual activity: Not Currently  Other Topics Concern  . Not on file  Social History Narrative  .  Not on file   Social Determinants of Health   Financial Resource Strain: Not on file  Food Insecurity: Not on file  Transportation Needs: Not on file  Physical Activity: Not on file  Stress: Not on file  Social Connections: Not on file     Family History: The patient's family history includes Colon polyps in her sister; Congestive Heart Failure in her mother.  ROS:   Review of Systems  Constitution: Negative for decreased appetite, fever and weight gain.  HENT: Negative for congestion, ear discharge, hoarse voice and sore throat.   Eyes: Negative for discharge, redness, vision loss in right eye and visual halos.  Cardiovascular: Negative for chest pain, dyspnea on exertion, leg swelling, orthopnea and palpitations.  Respiratory: Negative for cough, hemoptysis, shortness of breath and snoring.   Endocrine: Negative for heat intolerance and polyphagia.  Hematologic/Lymphatic: Negative for bleeding problem. Does not bruise/bleed easily.  Skin: Negative for flushing, nail changes, rash and suspicious lesions.  Musculoskeletal: Negative for arthritis, joint pain, muscle cramps, myalgias, neck pain and stiffness.  Gastrointestinal: Negative for abdominal pain, bowel incontinence, diarrhea and excessive appetite.  Genitourinary: Negative for decreased libido, genital sores and incomplete emptying.  Neurological: Negative for brief paralysis, focal weakness, headaches and loss of balance.  Psychiatric/Behavioral: Negative for altered mental status, depression and suicidal ideas.  Allergic/Immunologic: Negative for HIV exposure and persistent infections.    EKGs/Labs/Other Studies Reviewed:    The following studies were reviewed today:   EKG: None today  ZIO monitor The patient wore the monitor for 13 days and 7 hours starting December 03, 2019. Indication: Palpitations  The minimum heart rate was 40 bpm, maximum heart rate was 182 bpm, and average heart rate was 61  bpm. Predominant underlying rhythm was Sinus Rhythm.  178 Supraventricular Tachycardia runs occurred, the run with the fastest interval lasting 9 beats with a maximum rate of 182 bpm, the longest lasting 25.6 secs with an average rate of 118 bpm.   Atrial Fibrillation occurred (1% burden), ranging from 62-143 bpm (avg of 91 bpm), the longest lasting 2 hours 19 minutes with an average rate of 88 bpm.  Premature atrial complexes were frequent (7.7%, 84950). Premature Ventricular complexes were less than 1%.  There is suspected artifact which has been noted of ventricular tachycardia but based on my review of the monitor this is premature ventricular couplet with artifact.  No pauses or no AV block present.  12 patient triggered events 2 associated with atrial fibrillation and the remaining with sinus rhythm. 13 diary events 1 associated with atrial fibrillation.  Conclusion: This study is remarkable for the following: 1. Paroxysmal atrial fibrillation (1% burden). 2. Supraventricular tachycardia which is  likely atrial tachycardia with variable block. 3. Frequent premature atrial complex (7.7%, 84950  TTE IMPRESSIONS  1. Left ventricular ejection fraction, by estimation, is 60 to 65%. The left ventricle has normal function. The left ventricle has no regional wall motion abnormalities. There is mild left ventricular hypertrophy. Left ventricular diastolic parameters were normal.  2. Right ventricular systolic function is normal. The right ventricular size is normal. There is mildly elevated pulmonary artery systolic pressure.  3. The mitral valve is normal in structure. Mild mitral valve regurgitation. No evidence of mitral stenosis.  4. The aortic valve is normal in structure. Aortic valve regurgitation is not visualized. No  aortic stenosis is present.  5. There is mild dilatation of the ascending aorta, measuring 37 mm.  6. The inferior vena cava is normal in size with greater than 50% respiratory variability, suggesting right atrial pressure of 3 mmHg.    Recent Labs: 12/03/2019: Hemoglobin 14.5; Platelets 265; TSH 2.150 01/30/2020: BUN 16; Creatinine, Ser 0.95; Magnesium 2.2; Potassium 3.1; Sodium 137  Recent Lipid Panel No results found for: CHOL, TRIG, HDL, CHOLHDL, VLDL, LDLCALC, LDLDIRECT  Physical Exam:    VS:  BP 118/62   Pulse 65   Ht 5\' 5"  (1.651 m)   Wt 211 lb 12.8 oz (96.1 kg)   SpO2 96%   BMI 35.25 kg/m     Wt Readings from Last 3 Encounters:  02/24/20 211 lb 12.8 oz (96.1 kg)  01/30/20 209 lb 3.2 oz (94.9 kg)  12/03/19 209 lb 6.4 oz (95 kg)     GEN: Well nourished, well developed in no acute distress HEENT: Normal NECK: No JVD; No carotid bruits LYMPHATICS: No lymphadenopathy CARDIAC: S1S2 noted,RRR, no murmurs, rubs, gallops RESPIRATORY:  Clear to auscultation without rales, wheezing or rhonchi  ABDOMEN: Soft, non-tender, non-distended, +bowel sounds, no guarding. EXTREMITIES: No edema, No cyanosis, no clubbing MUSCULOSKELETAL:  No deformity  SKIN: Warm and dry NEUROLOGIC:  Alert and oriented x 3, non-focal PSYCHIATRIC:  Normal affect, good insight  ASSESSMENT:    1. Hypertension, unspecified type   2. PAF (paroxysmal atrial fibrillation) ([place])   3. PAT (paroxysmal atrial tachycardia) ([place])   4. PAC (premature atrial contraction)    PLAN:     1.  Paroxysmal atrial fibrillation-she started her Eliquis 5 mg twice a day.  We will continue her atenolol as well for rate control.  She is happy with rate control management and does not prefer to do any rhythm control and has declined for an evaluation for A. fib ablation.  This is not unreasonable we will continue to monitor the patient.  2.  Blood pressure is at target no changes to medication at this time.  The patient  is in agreement with the above plan. The patient left the office in stable condition.  The patient will follow up in 6 months or sooner if needed.   Medication Adjustments/Labs and Tests Ordered: Current medicines are reviewed at length with the patient today.  Concerns regarding medicines are outlined above.  Orders Placed This Encounter  Procedures  . CBC   No orders of the defined types were placed in this encounter.   Patient Instructions  Medication Instructions:  Your physician recommends that you continue on your current medications as directed. Please refer to the Current Medication list given to you today.  *If you need a refill on your cardiac medications before your next appointment, please call your pharmacy*   Lab Work: Your physician recommends that you return  for lab work in: [place] If you have labs (blood work) drawn today and your tests are completely normal, you will receive your results only by: Marland Kitchen MyChart Message (if you have MyChart) OR . A paper copy in the mail If you have any lab test that is abnormal or we need to change your treatment, we will call you to review the results.   Testing/Procedures: None   Follow-Up: At [facility], you and your health needs are our priority.  As part of our continuing mission to provide you with exceptional heart care, we have created designated Provider Care Teams.  These Care Teams include your primary Cardiologist (physician) and Advanced Practice Providers (APPs -  Physician Assistants and Nurse Practitioners) who all work together to provide you with the care you need, when you need it.  We recommend signing up for the patient portal called "MyChart".  Sign up information is provided on this After Visit Summary.  MyChart is used to connect with patients for Virtual Visits (Telemedicine).  Patients are able to view lab/test results, encounter notes, upcoming appointments, etc.  Non-urgent messages can be sent to  your provider as well.   To learn more about what you can do with MyChart, go to [email or web address].    Your next appointment:   6 month(s)  The format for your next appointment:   In Person  Provider:   Berniece Salines, DO   Other Instructions      Adopting a Healthy Lifestyle.  Know what a healthy weight is for you (roughly BMI <25) and aim to maintain this   Aim for 7+ servings of fruits and vegetables daily   65-80+ fluid ounces of water or unsweet tea for healthy kidneys   Limit to max 1 drink of alcohol per day; avoid smoking/tobacco   Limit animal fats in diet for cholesterol and heart health - choose grass fed whenever available   Avoid highly processed foods, and foods high in saturated/trans fats   Aim for low stress - take time to unwind and care for your mental health   Aim for 150 min of moderate intensity exercise weekly for heart health, and weights twice weekly for bone health   Aim for 7-9 hours of sleep daily   When it comes to diets, agreement about the perfect plan isnt easy to find, even among the experts. Experts at the [employer] developed an idea known as the Healthy Eating Plate. Just imagine a plate divided into logical, healthy portions.   The emphasis is on diet quality:   Load up on vegetables and fruits - one-half of your plate: Aim for color and variety, and remember that potatoes dont count.   Go for whole grains - one-quarter of your plate: Whole wheat, barley, wheat berries, quinoa, oats, brown rice, and foods made with them. If you want pasta, go with whole wheat pasta.   Protein power - one-quarter of your plate: Fish, chicken, beans, and nuts are all healthy, versatile protein sources. Limit red meat.   The diet, however, does go beyond the plate, offering a few other suggestions.   Use healthy plant oils, such as olive, canola, soy, corn, sunflower and peanut. Check the labels, and avoid partially  hydrogenated oil, which have unhealthy trans fats.   If youre thirsty, drink water. Coffee and tea are good in moderation, but skip sugary drinks and limit milk and dairy products to one or two daily servings.   The  type of carbohydrate in the diet is more important than the amount. Some sources of carbohydrates, such as vegetables, fruits, whole grains, and beans-are healthier than others.   Finally, stay active  Signed, Berniece Salines, DO  02/24/2020 11:41 AM    [place]

## 2020-02-24 NOTE — Patient Instructions (Signed)
Medication Instructions:  Your physician recommends that you continue on your current medications as directed. Please refer to the Current Medication list given to you today.  *If you need a refill on your cardiac medications before your next appointment, please call your pharmacy*   Lab Work: Your physician recommends that you return for lab work in: [place] If you have labs (blood work) drawn today and your tests are completely normal, you will receive your results only by: Marland Kitchen MyChart Message (if you have MyChart) OR . A paper copy in the mail If you have any lab test that is abnormal or we need to change your treatment, we will call you to review the results.   Testing/Procedures: None   Follow-Up: At [facility], you and your health needs are our priority.  As part of our continuing mission to provide you with exceptional heart care, we have created designated Provider Care Teams.  These Care Teams include your primary Cardiologist (physician) and Advanced Practice Providers (APPs -  Physician Assistants and Nurse Practitioners) who all work together to provide you with the care you need, when you need it.  We recommend signing up for the patient portal called "MyChart".  Sign up information is provided on this After Visit Summary.  MyChart is used to connect with patients for Virtual Visits (Telemedicine).  Patients are able to view lab/test results, encounter notes, upcoming appointments, etc.  Non-urgent messages can be sent to your provider as well.   To learn more about what you can do with MyChart, go to [email or web address].    Your next appointment:   6 month(s)  The format for your next appointment:   In Person  Provider:   Berniece Salines, DO   Other Instructions

## 2020-02-25 ENCOUNTER — Telehealth: Payer: Self-pay

## 2020-02-25 LAB — CBC
Hematocrit: 42.9 % (ref 34.0–46.6)
Hemoglobin: 14.4 g/dL (ref 11.1–15.9)
MCH: 29.6 pg (ref 26.6–33.0)
MCHC: 33.6 g/dL (ref 31.5–35.7)
MCV: 88 fL (ref 79–97)
Platelets: 261 10*3/uL (ref 150–450)
RBC: 4.86 x10E6/uL (ref 3.77–5.28)
RDW: 12.4 % (ref 11.7–15.4)
WBC: 9.9 10*3/uL (ref 3.4–10.8)

## 2020-02-25 NOTE — Telephone Encounter (Signed)
Spoke with patient regarding results and recommendation.  Patient verbalizes understanding and is agreeable to plan of care. Advised patient to call back with any issues or concerns.  

## 2020-02-25 NOTE — Telephone Encounter (Signed)
-----   Message from Berniece Salines, DO sent at 02/25/2020  8:52 AM EST ----- Labs normal

## 2020-02-26 ENCOUNTER — Telehealth: Payer: Self-pay | Admitting: Cardiology

## 2020-02-26 NOTE — Telephone Encounter (Signed)
Patient would like to start taking Vitamin A supplement (2400 mcg) and the bottle states do not take if you are taking a blood thinner without consulting with her physician. She would like to know if it is okay to take this supplement.

## 2020-02-26 NOTE — Telephone Encounter (Signed)
Called patient informed her of Dr. Terrial Rhodes recommendation she verbally understood no further questions.

## 2020-02-26 NOTE — Telephone Encounter (Signed)
Vitamin A increases the risk of bleeding on anticoagulant. If she is not vitamin A deficient I would not recommend she take it.

## 2020-05-05 ENCOUNTER — Telehealth: Payer: Self-pay | Admitting: Cardiology

## 2020-05-05 NOTE — Telephone Encounter (Signed)
    STAT if HR is under 50 or over 120 (normal HR is 60-100 beats per minute)  1) What is your heart rate?   2) Do you have a log of your heart rate readings (document readings)?   3) Do you have any other symptoms? Pt said she felt palpitations yesterday, she did not able to check her HR but she said she felt that her heart racing, she r/s her appt with Dr. Harriet Masson on 05/22/20. She is also wondering if her palpitations caused by drinking ciprofloxacin because she thought she have bladder infection. She said if the nurse can call her back before 11 am or after 3 pm today.

## 2020-05-05 NOTE — Telephone Encounter (Signed)
We can keep her scheduled appointment on April 8

## 2020-05-05 NOTE — Telephone Encounter (Signed)
Called patient. She reports that since yesterday she has been having palpitations since yesterday morning. No chest pain no shortness of breath. She reports she took a 500 mg ciprofloxacin pill yesterday morning, one her daughter brought from [place]. She thought she had a bladder infection that is why she took it. But feels maybe now that caused the palpitations. She is still having them off and on. Her pulse today is 56 and blood pressure 153/83. She wants to know if Dr. Harriet Masson feels as if she needs to be seen. She does not plan on taking anymore ciprofloxacin.

## 2020-05-05 NOTE — Telephone Encounter (Signed)
Called patient informed her that Dr. Harriet Masson will see her as scheduled for now. Patient aware to let us know if something changes or gets worse.

## 2020-05-11 DIAGNOSIS — M7062 Trochanteric bursitis, left hip: Secondary | ICD-10-CM | POA: Diagnosis not present

## 2020-05-22 ENCOUNTER — Ambulatory Visit: Payer: PPO | Admitting: Cardiology

## 2020-05-22 ENCOUNTER — Encounter: Payer: Self-pay | Admitting: Cardiology

## 2020-05-22 ENCOUNTER — Other Ambulatory Visit: Payer: Self-pay

## 2020-05-22 VITALS — BP 124/80 | HR 72 | Ht 65.0 in | Wt 212.0 lb

## 2020-05-22 DIAGNOSIS — I480 Paroxysmal atrial fibrillation: Secondary | ICD-10-CM

## 2020-05-22 DIAGNOSIS — I471 Supraventricular tachycardia: Secondary | ICD-10-CM | POA: Diagnosis not present

## 2020-05-22 DIAGNOSIS — I10 Essential (primary) hypertension: Secondary | ICD-10-CM | POA: Diagnosis not present

## 2020-05-22 DIAGNOSIS — I4719 Other supraventricular tachycardia: Secondary | ICD-10-CM

## 2020-05-22 DIAGNOSIS — E559 Vitamin D deficiency, unspecified: Secondary | ICD-10-CM

## 2020-05-22 MED ORDER — TORSEMIDE 20 MG PO TABS: 20.0000 mg | ORAL_TABLET | ORAL | 3 refills | Status: AC

## 2020-05-22 NOTE — Patient Instructions (Signed)
Medication Instructions:  Your physician has recommended you make the following change in your medication: START: Torsemide 20 mg every other day. *If you need a refill on your cardiac medications before your next appointment, please call your pharmacy*   Lab Work: Your physician recommends that you return for lab work: TODAY: BMET, Mag, Vitamin D If you have labs (blood work) drawn today and your tests are completely normal, you will receive your results only by: Marland Kitchen MyChart Message (if you have MyChart) OR . A paper copy in the mail If you have any lab test that is abnormal or we need to change your treatment, we will call you to review the results.   Testing/Procedures: None   Follow-Up: At [facility], you and your health needs are our priority.  As part of our continuing mission to provide you with exceptional heart care, we have created designated Provider Care Teams.  These Care Teams include your primary Cardiologist (physician) and Advanced Practice Providers (APPs -  Physician Assistants and Nurse Practitioners) who all work together to provide you with the care you need, when you need it.  We recommend signing up for the patient portal called "MyChart".  Sign up information is provided on this After Visit Summary.  MyChart is used to connect with patients for Virtual Visits (Telemedicine).  Patients are able to view lab/test results, encounter notes, upcoming appointments, etc.  Non-urgent messages can be sent to your provider as well.   To learn more about what you can do with MyChart, go to [email or web address].    Your next appointment:   6 month(s)  The format for your next appointment:   In Person  Provider:   Berniece Salines, DO   Other Instructions

## 2020-05-22 NOTE — Progress Notes (Signed)
Cardiology Office Note:    Date:  05/22/2020   ID:  Latasha Williams, DOB [birth year], MRN [id number]  PCP:  Penelope Coop, FNP  Cardiologist:  Berniece Salines, DO  Electrophysiologist:  None   Referring MD: Penelope Coop, FNP   I am doing well  History of Present Illness:    Latasha Williams is a 80 y.o. female with a hx of hypertension, restless leg syndrome presents monitor fibrillation seen on her recent monitor now on Eliquis and atenolol presents today for follow-up visit.  I saw the patient on February 24, 2020 at that time she denied any rhythm control strategies.  We will continue patient on atenolol Eliquis.  She is here today for follow-up visit she offers no complaints.  Past Medical History:  Diagnosis Date  . Arthritis   . GERD (gastroesophageal reflux disease)   . History of skin cancer    BASAL CELL  . Hypertension   . Restless leg syndrome    OCCASIONAL PROBLEM ONLY    Past Surgical History:  Procedure Laterality Date  . BREAST CYST EXCISION     bilateral  benign  . CHOLECYSTECTOMY  2002  . DILATION AND CURETTAGE OF UTERUS  2013  . TOTAL KNEE ARTHROPLASTY Right 05/21/2012   Procedure: TOTAL KNEE ARTHROPLASTY;  Surgeon: Gearlean Alf, MD;  Location: [facility];  Service: Orthopedics;  Laterality: Right;    Current Medications: Current Meds  Medication Sig  . acetaminophen (TYLENOL) 325 MG tablet Take 2 tablets (650 mg total) by mouth every 6 (six) hours as needed.  Marland Kitchen apixaban (ELIQUIS) 5 MG TABS tablet Take 1 tablet (5 mg total) by mouth 2 (two) times daily.  Marland Kitchen atenolol-chlorthalidone (TENORETIC) 50-25 MG per tablet Take 0.5 tablets by mouth daily before breakfast.   . benzonatate (TESSALON) 200 MG capsule Take 200 mg by mouth 3 (three) times daily as needed.  . cephALEXin (KEFLEX) 250 MG capsule Take 250 mg by mouth daily.  . chlorpheniramine (CHLOR-TRIMETON) 4 MG tablet Take 4 mg by mouth 2 (two) times daily as needed for allergies.  . ciprofloxacin (CIPRO)  250 MG tablet Take 250 mg by mouth daily with breakfast.  . Folic Acid-Cholecalciferol 1-500 MG-UNIT TABS Take 1 mg by mouth daily.  . methocarbamol (ROBAXIN) 500 MG tablet Take 1 tablet (500 mg total) by mouth every 6 (six) hours as needed.  . Multiple Vitamins-Minerals (B COMPLEX-C-E-ZINC) tablet Take 1 tablet by mouth daily.  Marland Kitchen omeprazole (PRILOSEC) 20 MG capsule Take 20 mg by mouth daily.  . ondansetron (ZOFRAN) 4 MG tablet Take 1 tablet (4 mg total) by mouth every 6 (six) hours as needed for nausea.  . potassium chloride SA (KLOR-CON) 20 MEQ tablet Take 1 tablet (20 mEq total) by mouth daily.  . pramipexole (MIRAPEX) 0.5 MG tablet Take 1 tablet by mouth at bedtime.  . torsemide (DEMADEX) 20 MG tablet Take 1 tablet (20 mg total) by mouth every other day.  . traMADol (ULTRAM) 50 MG tablet Take 1-2 tablets (50-100 mg total) by mouth every 6 (six) hours as needed (mild pain).  . Vitamin D, Ergocalciferol, (DRISDOL) 1.25 MG (50000 UNIT) CAPS capsule Take 50,000 Units by mouth once a week.  . [DISCONTINUED] torsemide (DEMADEX) 20 MG tablet Take 20 mg by mouth daily.     Allergies:   Patient has no known allergies.   Social History   Socioeconomic History  . Marital status: Unknown    Spouse name: Not on file  .  Number of children: Not on file  . Years of education: Not on file  . Highest education level: Not on file  Occupational History  . Not on file  Tobacco Use  . Smoking status: Never Smoker  . Smokeless tobacco: Never Used  Substance and Sexual Activity  . Alcohol use: No  . Drug use: No  . Sexual activity: Not Currently  Other Topics Concern  . Not on file  Social History Narrative  . Not on file   Social Determinants of Health   Financial Resource Strain: Not on file  Food Insecurity: Not on file  Transportation Needs: Not on file  Physical Activity: Not on file  Stress: Not on file  Social Connections: Not on file     Family History: The patient's family  history includes Colon polyps in her sister; Congestive Heart Failure in her mother.  ROS:   Review of Systems  Constitution: Negative for decreased appetite, fever and weight gain.  HENT: Negative for congestion, ear discharge, hoarse voice and sore throat.   Eyes: Negative for discharge, redness, vision loss in right eye and visual halos.  Cardiovascular: Negative for chest pain, dyspnea on exertion, leg swelling, orthopnea and palpitations.  Respiratory: Negative for cough, hemoptysis, shortness of breath and snoring.   Endocrine: Negative for heat intolerance and polyphagia.  Hematologic/Lymphatic: Negative for bleeding problem. Does not bruise/bleed easily.  Skin: Negative for flushing, nail changes, rash and suspicious lesions.  Musculoskeletal: Negative for arthritis, joint pain, muscle cramps, myalgias, neck pain and stiffness.  Gastrointestinal: Negative for abdominal pain, bowel incontinence, diarrhea and excessive appetite.  Genitourinary: Negative for decreased libido, genital sores and incomplete emptying.  Neurological: Negative for brief paralysis, focal weakness, headaches and loss of balance.  Psychiatric/Behavioral: Negative for altered mental status, depression and suicidal ideas.  Allergic/Immunologic: Negative for HIV exposure and persistent infections.    EKGs/Labs/Other Studies Reviewed:    The following studies were reviewed today:   EKG: None today  ZIO monitor The patient wore the monitor for 13 days and 7 hours starting December 03, 2019. Indication: Palpitations  The minimum heart rate was 40 bpm, maximum heart rate was 182 bpm, and average heart rate was 61 bpm. Predominant underlying rhythm was Sinus Rhythm.  178 Supraventricular Tachycardia runs occurred, the run with the fastest interval lasting 9 beats with a maximum rate of 182 bpm, the longest lasting 25.6 secs with an average rate of 118 bpm.   Atrial Fibrillation occurred (1% burden),  ranging from 62-143 bpm (avg of 91 bpm), the longest lasting 2 hours 19 minutes with an average rate of 88 bpm.  Premature atrial complexes were frequent (7.7%, 84950). Premature Ventricular complexes were less than 1%.  There is suspected artifact which has been noted of ventricular tachycardia but based on my review of the monitor this is premature ventricular couplet with artifact.  No pauses or no AV block present.  12 patient triggered events 2 associated with atrial fibrillation and the remaining with sinus rhythm. 13 diary events 1 associated with atrial fibrillation.  Conclusion: This study is remarkable for the following: 1. Paroxysmal atrial fibrillation (1% burden). 2. Supraventricular tachycardia which is likely atrial tachycardia with variable block. 3. Frequent premature atrial complex (7.7%, 84950  TTE IMPRESSIONS  1. Left ventricular ejection fraction, by estimation, is 60 to 65%. The left ventricle has normal function. The left ventricle has no regional wall motion abnormalities. There is mild left ventricular hypertrophy. Left ventricular diastolic parameters were normal.  2. Right ventricular systolic function is normal. The right ventricular size is normal. There is mildly elevated pulmonary artery systolic pressure.  3. The mitral valve is normal in structure. Mild mitral valve regurgitation. No evidence of mitral stenosis.  4. The aortic valve is normal in structure. Aortic valve regurgitation is not visualized. No aortic stenosis is present.  5. There is mild dilatation of the ascending aorta, measuring 37 mm.  6. The inferior vena cava is normal in size with greater than 50% respiratory variability, suggesting right atrial pressure of 3 mmHg.   Recent Labs: 12/03/2019: TSH 2.150 01/30/2020: BUN 16;  Creatinine, Ser 0.95; Magnesium 2.2; Potassium 3.1; Sodium 137 02/24/2020: Hemoglobin 14.4; Platelets 261  Recent Lipid Panel No results found for: CHOL, TRIG, HDL, CHOLHDL, VLDL, LDLCALC, LDLDIRECT  Physical Exam:    VS:  BP 124/80   Pulse 72   Ht 5\' 5"  (1.651 m)   Wt 212 lb (96.2 kg)   SpO2 97%   BMI 35.28 kg/m     Wt Readings from Last 3 Encounters:  05/22/20 212 lb (96.2 kg)  02/24/20 211 lb 12.8 oz (96.1 kg)  01/30/20 209 lb 3.2 oz (94.9 kg)     GEN: Well nourished, well developed in no acute distress HEENT: Normal NECK: No JVD; No carotid bruits LYMPHATICS: No lymphadenopathy CARDIAC: S1S2 noted,RRR, no murmurs, rubs, gallops RESPIRATORY:  Clear to auscultation without rales, wheezing or rhonchi  ABDOMEN: Soft, non-tender, non-distended, +bowel sounds, no guarding. EXTREMITIES: No edema, No cyanosis, no clubbing MUSCULOSKELETAL:  No deformity  SKIN: Warm and dry NEUROLOGIC:  Alert and oriented x 3, non-focal PSYCHIATRIC:  Normal affect, good insight  ASSESSMENT:    1. PAF (paroxysmal atrial fibrillation) ([place])   2.  Frequent PAT (paroxysmal atrial tachycardia) ([facility])   3. Hypertension, unspecified type   4. Vitamin D deficiency    PLAN:    We will continue patient Eliquis and atenolol.  She still has declined any attempt for rhythm control. Her blood pressure acceptable continue her current medication regimen. We will get blood work today for [employer] as well as vitamin D level in the setting of vitamin D deficiency. She is euvolemic we will cut back on her torsemide to 20 mg every other day.  As well as her potassium will change down to 20 mEq p.o. today as well. The patient understands the need to lose weight with diet and exercise. We have discussed specific strategies for this.  The patient is in agreement with the above plan. The patient left the office in stable condition.  The patient will follow up in 6 months or sooner if needed.   Medication  Adjustments/Labs and Tests Ordered: Current medicines are reviewed at length with the patient today.  Concerns regarding medicines are outlined above.  Orders Placed This Encounter  Procedures  . Basic metabolic panel  . Magnesium  . VITAMIN D 25 Hydroxy (Vit-D Deficiency, Fractures)   Meds ordered this encounter  Medications  . torsemide (DEMADEX) 20 MG tablet    Sig: Take 1 tablet (20 mg total) by mouth every other day.    Dispense:  45 tablet    Refill:  3    Patient Instructions  Medication Instructions:  Your physician has recommended you make the following change in your medication: START: Torsemide 20 mg every other day. *If you need a refill on your cardiac medications before your next appointment, please call your pharmacy*   Lab Work: Your physician recommends that you  return for lab work: TODAY: BMET, Mag, Vitamin D If you have labs (blood work) drawn today and your tests are completely normal, you will receive your results only by: Marland Kitchen MyChart Message (if you have MyChart) OR . A paper copy in the mail If you have any lab test that is abnormal or we need to change your treatment, we will call you to review the results.   Testing/Procedures: None   Follow-Up: At [facility], you and your health needs are our priority.  As part of our continuing mission to provide you with exceptional heart care, we have created designated Provider Care Teams.  These Care Teams include your primary Cardiologist (physician) and Advanced Practice Providers (APPs -  Physician Assistants and Nurse Practitioners) who all work together to provide you with the care you need, when you need it.  We recommend signing up for the patient portal called "MyChart".  Sign up information is provided on this After Visit Summary.  MyChart is used to connect with patients for Virtual Visits (Telemedicine).  Patients are able to view lab/test results, encounter notes, upcoming appointments, etc.   Non-urgent messages can be sent to your provider as well.   To learn more about what you can do with MyChart, go to [email or web address].    Your next appointment:   6 month(s)  The format for your next appointment:   In Person  Provider:   Berniece Salines, DO   Other Instructions      Adopting a Healthy Lifestyle.  Know what a healthy weight is for you (roughly BMI <25) and aim to maintain this   Aim for 7+ servings of fruits and vegetables daily   65-80+ fluid ounces of water or unsweet tea for healthy kidneys   Limit to max 1 drink of alcohol per day; avoid smoking/tobacco   Limit animal fats in diet for cholesterol and heart health - choose grass fed whenever available   Avoid highly processed foods, and foods high in saturated/trans fats   Aim for low stress - take time to unwind and care for your mental health   Aim for 150 min of moderate intensity exercise weekly for heart health, and weights twice weekly for bone health   Aim for 7-9 hours of sleep daily   When it comes to diets, agreement about the perfect plan isnt easy to find, even among the experts. Experts at the [employer] developed an idea known as the Healthy Eating Plate. Just imagine a plate divided into logical, healthy portions.   The emphasis is on diet quality:   Load up on vegetables and fruits - one-half of your plate: Aim for color and variety, and remember that potatoes dont count.   Go for whole grains - one-quarter of your plate: Whole wheat, barley, wheat berries, quinoa, oats, brown rice, and foods made with them. If you want pasta, go with whole wheat pasta.   Protein power - one-quarter of your plate: Fish, chicken, beans, and nuts are all healthy, versatile protein sources. Limit red meat.   The diet, however, does go beyond the plate, offering a few other suggestions.   Use healthy plant oils, such as olive, canola, soy, corn, sunflower and peanut. Check the  labels, and avoid partially hydrogenated oil, which have unhealthy trans fats.   If youre thirsty, drink water. Coffee and tea are good in moderation, but skip sugary drinks and limit milk and dairy products to one or two daily servings.  The type of carbohydrate in the diet is more important than the amount. Some sources of carbohydrates, such as vegetables, fruits, whole grains, and beans-are healthier than others.   Finally, stay active  Signed, Berniece Salines, DO  05/22/2020 2:35 PM    [facility]

## 2020-05-23 LAB — VITAMIN D 25 HYDROXY (VIT D DEFICIENCY, FRACTURES): Vit D, 25-Hydroxy: 28.3 ng/mL — ABNORMAL LOW (ref 30.0–100.0)

## 2020-05-23 LAB — BASIC METABOLIC PANEL
BUN/Creatinine Ratio: 21 (ref 12–28)
BUN: 22 mg/dL (ref 8–27)
CO2: 25 mmol/L (ref 20–29)
Calcium: 9.1 mg/dL (ref 8.7–10.3)
Chloride: 93 mmol/L — ABNORMAL LOW (ref 96–106)
Creatinine, Ser: 1.06 mg/dL — ABNORMAL HIGH (ref 0.57–1.00)
Glucose: 99 mg/dL (ref 65–99)
Potassium: 3.3 mmol/L — ABNORMAL LOW (ref 3.5–5.2)
Sodium: 136 mmol/L (ref 134–144)
eGFR: 52 mL/min/{1.73_m2} — ABNORMAL LOW (ref >59)

## 2020-05-23 LAB — MAGNESIUM: Magnesium: 2.4 mg/dL — ABNORMAL HIGH (ref 1.6–2.3)

## 2020-05-25 DIAGNOSIS — R300 Dysuria: Secondary | ICD-10-CM | POA: Diagnosis not present

## 2020-05-27 ENCOUNTER — Other Ambulatory Visit: Payer: Self-pay

## 2020-05-27 MED ORDER — VITAMIN D (ERGOCALCIFEROL) 1.25 MG (50000 UNIT) PO CAPS: 50000.0000 [IU] | ORAL_CAPSULE | ORAL | 0 refills | Status: AC

## 2020-05-28 ENCOUNTER — Telehealth: Payer: Self-pay | Admitting: Cardiology

## 2020-05-28 NOTE — Telephone Encounter (Incomplete)
°*  STAT* If patient is at the pharmacy, call can be transferred to refill team. ° ° °1. Which medications need to be refilled? (please list name of each medication and dose if known) ***** ° °2. Which pharmacy/location (including street and city if local pharmacy) is medication to be sent to?**** ° °3. Do they need a 30 day or 90 day supply? *** ° °

## 2020-06-03 ENCOUNTER — Other Ambulatory Visit: Payer: Self-pay

## 2020-06-03 DIAGNOSIS — I10 Essential (primary) hypertension: Secondary | ICD-10-CM | POA: Diagnosis not present

## 2020-06-03 DIAGNOSIS — I491 Atrial premature depolarization: Secondary | ICD-10-CM

## 2020-06-03 DIAGNOSIS — E876 Hypokalemia: Secondary | ICD-10-CM

## 2020-06-04 LAB — BASIC METABOLIC PANEL
BUN/Creatinine Ratio: 16 (ref 12–28)
BUN: 16 mg/dL (ref 8–27)
CO2: 27 mmol/L (ref 20–29)
Calcium: 9.2 mg/dL (ref 8.7–10.3)
Chloride: 94 mmol/L — ABNORMAL LOW (ref 96–106)
Creatinine, Ser: 1.01 mg/dL — ABNORMAL HIGH (ref 0.57–1.00)
Glucose: 103 mg/dL — ABNORMAL HIGH (ref 65–99)
Potassium: 3.3 mmol/L — ABNORMAL LOW (ref 3.5–5.2)
Sodium: 138 mmol/L (ref 134–144)
eGFR: 56 mL/min/{1.73_m2} — ABNORMAL LOW (ref >59)

## 2020-06-04 LAB — MAGNESIUM: Magnesium: 2.2 mg/dL (ref 1.6–2.3)

## 2020-06-04 LAB — VITAMIN D 25 HYDROXY (VIT D DEFICIENCY, FRACTURES): Vit D, 25-Hydroxy: 28.7 ng/mL — ABNORMAL LOW (ref 30.0–100.0)

## 2020-06-08 ENCOUNTER — Other Ambulatory Visit: Payer: Self-pay

## 2020-06-08 DIAGNOSIS — E876 Hypokalemia: Secondary | ICD-10-CM

## 2020-06-08 MED ORDER — POTASSIUM CHLORIDE CRYS ER 20 MEQ PO TBCR: 20.0000 meq | EXTENDED_RELEASE_TABLET | Freq: Two times a day (BID) | ORAL | 3 refills | Status: DC

## 2020-06-11 DIAGNOSIS — N390 Urinary tract infection, site not specified: Secondary | ICD-10-CM | POA: Diagnosis not present

## 2020-06-11 DIAGNOSIS — Z6836 Body mass index (BMI) 36.0-36.9, adult: Secondary | ICD-10-CM | POA: Diagnosis not present

## 2020-06-11 DIAGNOSIS — R300 Dysuria: Secondary | ICD-10-CM | POA: Diagnosis not present

## 2020-06-12 DIAGNOSIS — E876 Hypokalemia: Secondary | ICD-10-CM | POA: Diagnosis not present

## 2020-06-13 LAB — BASIC METABOLIC PANEL
BUN/Creatinine Ratio: 11 — ABNORMAL LOW (ref 12–28)
BUN: 10 mg/dL (ref 8–27)
CO2: 22 mmol/L (ref 20–29)
Calcium: 9.1 mg/dL (ref 8.7–10.3)
Chloride: 95 mmol/L — ABNORMAL LOW (ref 96–106)
Creatinine, Ser: 0.9 mg/dL (ref 0.57–1.00)
Glucose: 91 mg/dL (ref 65–99)
Potassium: 3.3 mmol/L — ABNORMAL LOW (ref 3.5–5.2)
Sodium: 138 mmol/L (ref 134–144)
eGFR: 64 mL/min/{1.73_m2} (ref >59)

## 2020-06-15 DIAGNOSIS — N3090 Cystitis, unspecified without hematuria: Secondary | ICD-10-CM | POA: Diagnosis not present

## 2020-06-15 DIAGNOSIS — N390 Urinary tract infection, site not specified: Secondary | ICD-10-CM | POA: Diagnosis not present

## 2020-06-15 DIAGNOSIS — N952 Postmenopausal atrophic vaginitis: Secondary | ICD-10-CM | POA: Diagnosis not present

## 2020-06-18 ENCOUNTER — Telehealth: Payer: Self-pay | Admitting: Cardiology

## 2020-06-18 NOTE — Telephone Encounter (Signed)
Patient was calling in to get the results from her lab right that was done on 4/29. Patient is also wanting to know is there any assistance in getting the apixaban (ELIQUIS) 5 MG TABS tablet. Please advise

## 2020-06-22 NOTE — Telephone Encounter (Signed)
Spoke with patient. See chart.  

## 2020-06-26 ENCOUNTER — Telehealth: Payer: Self-pay | Admitting: Cardiology

## 2020-06-26 NOTE — Telephone Encounter (Signed)
Patient would like to know if the nurse could call her to let her know how her vitamin D was on her lab results

## 2020-07-02 NOTE — Telephone Encounter (Signed)
Spoke with patient about her results, she states she does not remember speaking with anyone about the results but she has been taking the potassium twice daily. Patient verbalizes understanding of the need to continue potassium twice daily. No further questions or concerns at this time.

## 2020-07-02 NOTE — Telephone Encounter (Signed)
    Pt is calling back again to follow up. She also wanted to know if she needs to continue taking her potasium twice a day. She asked if someone can call her back today

## 2020-07-07 ENCOUNTER — Telehealth: Payer: Self-pay | Admitting: Cardiology

## 2020-07-07 NOTE — Telephone Encounter (Signed)
Pt c/o medication issue: 1. Name of Medication: Eliquis  2. How are you currently taking this medication (dosage and times per day)? 2 times a day 3. Are you having a reaction (difficulty breathing--STAT)?  No  4. What is your medication issue? Patient need some assistant

## 2020-07-08 ENCOUNTER — Other Ambulatory Visit: Payer: Self-pay | Admitting: *Deleted

## 2020-07-08 ENCOUNTER — Other Ambulatory Visit: Payer: Self-pay

## 2020-07-08 ENCOUNTER — Telehealth: Payer: Self-pay

## 2020-07-08 DIAGNOSIS — Z79899 Other long term (current) drug therapy: Secondary | ICD-10-CM

## 2020-07-08 LAB — BASIC METABOLIC PANEL
BUN/Creatinine Ratio: 17 (ref 12–28)
BUN: 17 mg/dL (ref 8–27)
CO2: 27 mmol/L (ref 20–29)
Calcium: 9.5 mg/dL (ref 8.7–10.3)
Chloride: 92 mmol/L — ABNORMAL LOW (ref 96–106)
Creatinine, Ser: 1.03 mg/dL — ABNORMAL HIGH (ref 0.57–1.00)
Glucose: 112 mg/dL — ABNORMAL HIGH (ref 65–99)
Potassium: 3 mmol/L — ABNORMAL LOW (ref 3.5–5.2)
Sodium: 137 mmol/L (ref 134–144)
eGFR: 54 mL/min/{1.73_m2} — ABNORMAL LOW (ref >59)

## 2020-07-08 LAB — MAGNESIUM: Magnesium: 2.1 mg/dL (ref 1.6–2.3)

## 2020-07-08 NOTE — Telephone Encounter (Signed)
Tried calling patient. No answer, line continued to ring unable to leave a message.

## 2020-07-08 NOTE — Telephone Encounter (Signed)
Fax sent.

## 2020-07-08 NOTE — Telephone Encounter (Signed)
Follow up:    Patient returning a call back for results

## 2020-07-08 NOTE — Telephone Encounter (Signed)
spoke with patient about her call yesterday, patient will come in today or tomorrow to get her lab redrawn pr orders from Dr. Harriet Masson. She asked about her assistance paperwork. I found it and will have Dr. Harriet Masson sign them today and fax them off. Patient verbalizes understanding. No further questions or concerns at this time.

## 2020-07-10 ENCOUNTER — Other Ambulatory Visit: Payer: Self-pay

## 2020-07-10 MED ORDER — POTASSIUM CHLORIDE CRYS ER 20 MEQ PO TBCR: 40.0000 meq | EXTENDED_RELEASE_TABLET | Freq: Two times a day (BID) | ORAL | 3 refills | Status: DC

## 2020-07-10 NOTE — Progress Notes (Signed)
Prescription sent to pharmacy.

## 2020-07-23 NOTE — Telephone Encounter (Signed)
    Pt is calling to f/u her pt assistance enrollment for her eliquis

## 2020-07-23 NOTE — Telephone Encounter (Signed)
Form not in Dr. Terrial Rhodes box.

## 2020-07-23 NOTE — Telephone Encounter (Signed)
Pt advised that we have not received form.

## 2020-08-04 ENCOUNTER — Ambulatory Visit: Payer: PPO | Admitting: Cardiology

## 2020-08-06 ENCOUNTER — Telehealth: Payer: Self-pay

## 2020-08-06 NOTE — Telephone Encounter (Signed)
Spoke with patient about her patient assistance form, she is upset because she has not heard anything back from them. Patient reassured the paperwork was faxed on 5/25 and refaxed today. She was also inquiring about lab work to be drawn to check her Vitamin D level. Will relay information to Dr. Harriet Masson and call patient back.

## 2020-08-06 NOTE — Telephone Encounter (Signed)
Spoke with patient about her patient assistance paperwork. Per [facility] staff she needs out-of-pocket proof of $601.35 to finish processing her application. Patient informed to discontinue the Vitamin D she had refilled and contact her PCP to have Vitamin D redrawn since taking the medication from Dr. Harriet Masson. Patient verbalizes understanding. No further questions or concerns at this time.

## 2020-08-10 ENCOUNTER — Telehealth: Payer: Self-pay

## 2020-08-10 NOTE — Telephone Encounter (Signed)
Called patient, see chart.

## 2020-08-10 NOTE — Telephone Encounter (Signed)
Pt is calling back about her labwork being [place] [facility] office, Pt states she's been waiting all morning to get the labwork, she called her PCP office and they dont have an order from [facility]. Please advise pt further

## 2020-08-10 NOTE — Telephone Encounter (Signed)
Spoke with patient about having her PCP draw her labs to check her Vitamin D when they drawing routine lab work. Patients her PCP said they can not draw it without our permission. Patient request I call her PCP to resolve the issue. Will call wen I get the chance. Patient verbalizes understanding. No further questions or concerns at this time.

## 2020-08-10 NOTE — Telephone Encounter (Signed)
Called patient's PCP, spoke with Missy. She states the patient told them the lab work needed to be drawn today, I let the staff know I told the patient the Vitamin D level should be monitored by them and we request it be added to the patient's routine lab draw. Missy verbalized understanding, no questions at this time. She states the patient will be calling them back today and she will reinforce the

## 2020-08-10 NOTE — Telephone Encounter (Signed)
Called patient's PCP spoke with Missy. She states the patient called and states she needed her Vitamin D checked today per our office. I informed Missy we instructed the patient to request a vitamin D level be added to her routine lab draw. Missy states the patient is calling back later and she will reinforce what was already told to the patient by me.

## 2020-08-27 DIAGNOSIS — R300 Dysuria: Secondary | ICD-10-CM | POA: Diagnosis not present

## 2020-08-27 DIAGNOSIS — R8290 Unspecified abnormal findings in urine: Secondary | ICD-10-CM | POA: Diagnosis not present

## 2020-08-27 DIAGNOSIS — E559 Vitamin D deficiency, unspecified: Secondary | ICD-10-CM | POA: Diagnosis not present

## 2020-08-27 DIAGNOSIS — Z6836 Body mass index (BMI) 36.0-36.9, adult: Secondary | ICD-10-CM | POA: Diagnosis not present

## 2020-08-27 DIAGNOSIS — M1990 Unspecified osteoarthritis, unspecified site: Secondary | ICD-10-CM | POA: Diagnosis not present

## 2020-08-27 DIAGNOSIS — I4891 Unspecified atrial fibrillation: Secondary | ICD-10-CM | POA: Diagnosis not present

## 2020-08-27 DIAGNOSIS — G2581 Restless legs syndrome: Secondary | ICD-10-CM | POA: Diagnosis not present

## 2020-08-27 DIAGNOSIS — Z1231 Encounter for screening mammogram for malignant neoplasm of breast: Secondary | ICD-10-CM | POA: Diagnosis not present

## 2020-08-27 DIAGNOSIS — Z136 Encounter for screening for cardiovascular disorders: Secondary | ICD-10-CM | POA: Diagnosis not present

## 2020-08-27 DIAGNOSIS — N390 Urinary tract infection, site not specified: Secondary | ICD-10-CM | POA: Diagnosis not present

## 2020-08-27 DIAGNOSIS — I50810 Right heart failure, unspecified: Secondary | ICD-10-CM | POA: Diagnosis not present

## 2020-08-27 DIAGNOSIS — Z79899 Other long term (current) drug therapy: Secondary | ICD-10-CM | POA: Diagnosis not present

## 2020-08-27 DIAGNOSIS — R739 Hyperglycemia, unspecified: Secondary | ICD-10-CM | POA: Diagnosis not present

## 2020-09-14 DIAGNOSIS — N289 Disorder of kidney and ureter, unspecified: Secondary | ICD-10-CM | POA: Diagnosis not present

## 2020-09-15 DIAGNOSIS — Z1231 Encounter for screening mammogram for malignant neoplasm of breast: Secondary | ICD-10-CM | POA: Diagnosis not present

## 2020-09-29 DIAGNOSIS — R300 Dysuria: Secondary | ICD-10-CM | POA: Diagnosis not present

## 2020-10-30 ENCOUNTER — Telehealth: Payer: Self-pay | Admitting: Cardiology

## 2020-10-30 NOTE — Telephone Encounter (Signed)
Patient calling the office for samples of medication:   1.  What medication and dosage are you requesting samples for? apixaban (ELIQUIS) 5 MG TABS tablet  2.  Are you currently out of this medication? Has about a week left

## 2020-10-30 NOTE — Telephone Encounter (Signed)
Spoke to the patient just now and let her know that I have these samples ready for her to pick up.

## 2020-11-09 DIAGNOSIS — Z6835 Body mass index (BMI) 35.0-35.9, adult: Secondary | ICD-10-CM | POA: Diagnosis not present

## 2020-11-09 DIAGNOSIS — M79645 Pain in left finger(s): Secondary | ICD-10-CM | POA: Diagnosis not present

## 2020-11-30 ENCOUNTER — Telehealth: Payer: Self-pay | Admitting: Cardiology

## 2020-11-30 NOTE — Telephone Encounter (Signed)
Pt qualifies for 5mg  eliquis bid routing to [facility]ill team to place sample and please provide a pt assistance form Form: [email or web address]

## 2020-11-30 NOTE — Telephone Encounter (Signed)
Patient calling the office for samples of medication:   1.  What medication and dosage are you requesting samples for? apixaban (ELIQUIS) 5 MG TABS tablet  2.  Are you currently out of this medication? Yes; patient also mentioned that she is in [place]

## 2020-12-01 MED ORDER — APIXABAN 5 MG PO TABS: 5.0000 mg | ORAL_TABLET | Freq: Two times a day (BID) | ORAL | 0 refills | Status: DC

## 2020-12-01 NOTE — Telephone Encounter (Signed)
Let patient know that samples for her Eliquis has been left at the front desk for pick up.

## 2020-12-04 NOTE — Telephone Encounter (Signed)
Pt came to [facility] to get samples and was upset they were not ready. Apparently this message went to [facility].  Gave pt 2 boxes of Eliquis 5mg  and a pt assistance form.

## 2020-12-07 DIAGNOSIS — Z139 Encounter for screening, unspecified: Secondary | ICD-10-CM | POA: Diagnosis not present

## 2020-12-07 DIAGNOSIS — E782 Mixed hyperlipidemia: Secondary | ICD-10-CM | POA: Diagnosis not present

## 2020-12-07 DIAGNOSIS — N1831 Chronic kidney disease, stage 3a: Secondary | ICD-10-CM | POA: Diagnosis not present

## 2020-12-07 DIAGNOSIS — M25569 Pain in unspecified knee: Secondary | ICD-10-CM | POA: Diagnosis not present

## 2020-12-07 DIAGNOSIS — R7303 Prediabetes: Secondary | ICD-10-CM | POA: Diagnosis not present

## 2020-12-07 DIAGNOSIS — E559 Vitamin D deficiency, unspecified: Secondary | ICD-10-CM | POA: Diagnosis not present

## 2020-12-07 DIAGNOSIS — I4891 Unspecified atrial fibrillation: Secondary | ICD-10-CM | POA: Diagnosis not present

## 2020-12-07 DIAGNOSIS — Z9181 History of falling: Secondary | ICD-10-CM | POA: Diagnosis not present

## 2020-12-07 DIAGNOSIS — I110 Hypertensive heart disease with heart failure: Secondary | ICD-10-CM | POA: Diagnosis not present

## 2020-12-07 DIAGNOSIS — K219 Gastro-esophageal reflux disease without esophagitis: Secondary | ICD-10-CM | POA: Diagnosis not present

## 2020-12-07 DIAGNOSIS — N390 Urinary tract infection, site not specified: Secondary | ICD-10-CM | POA: Diagnosis not present

## 2020-12-07 DIAGNOSIS — I129 Hypertensive chronic kidney disease with stage 1 through stage 4 chronic kidney disease, or unspecified chronic kidney disease: Secondary | ICD-10-CM | POA: Diagnosis not present

## 2020-12-07 DIAGNOSIS — Z1331 Encounter for screening for depression: Secondary | ICD-10-CM | POA: Diagnosis not present

## 2020-12-15 ENCOUNTER — Telehealth: Payer: Self-pay | Admitting: Cardiology

## 2020-12-15 DIAGNOSIS — E876 Hypokalemia: Secondary | ICD-10-CM | POA: Diagnosis not present

## 2020-12-15 MED ORDER — APIXABAN 5 MG PO TABS: 5.0000 mg | ORAL_TABLET | Freq: Two times a day (BID) | ORAL | 1 refills | Status: DC

## 2020-12-15 NOTE — Telephone Encounter (Signed)
Prescription refill request for Eliquis received. Indication:afib Last office visit:tobb 05/22/20 Scr:0.98 09/14/20 Age: 49f Weight: 96.2kg

## 2020-12-15 NOTE — Telephone Encounter (Signed)
 *  STAT* If patient is at the pharmacy, call can be transferred to refill team.   1. Which medications need to be refilled? (please list name of each medication and dose if known) apixaban (ELIQUIS) 5 MG TABS tablet  2. Which pharmacy/location (including street and city if local pharmacy) is medication to be sent to? [facility]  3. Do they need a 30 day or 90 day supply? 90 days

## 2020-12-15 NOTE — Telephone Encounter (Signed)
Called pt. She is made aware the refill request have been submitted. "It will cost me $150 dollars to get that medication." After asking [facility] staff if they had samples. They will be available for the pt to pick up. She verbalized understanding.

## 2020-12-15 NOTE — Telephone Encounter (Signed)
Patient calling the office for samples of medication:   1.  What medication and dosage are you requesting samples for? apixaban (ELIQUIS) 5 MG TABS tablet  2.  Are you currently out of this medication? Yes    

## 2020-12-16 ENCOUNTER — Telehealth: Payer: Self-pay

## 2020-12-16 NOTE — Telephone Encounter (Signed)
Samples ready for pick up per Jasmine's request

## 2020-12-22 DIAGNOSIS — N3090 Cystitis, unspecified without hematuria: Secondary | ICD-10-CM | POA: Diagnosis not present

## 2020-12-22 DIAGNOSIS — K5900 Constipation, unspecified: Secondary | ICD-10-CM | POA: Diagnosis not present

## 2020-12-22 DIAGNOSIS — N952 Postmenopausal atrophic vaginitis: Secondary | ICD-10-CM | POA: Diagnosis not present

## 2020-12-25 ENCOUNTER — Other Ambulatory Visit: Payer: Self-pay | Admitting: Cardiology

## 2021-02-25 ENCOUNTER — Encounter: Payer: Self-pay | Admitting: Cardiology

## 2021-02-25 ENCOUNTER — Ambulatory Visit: Payer: PPO | Admitting: Cardiology

## 2021-02-25 ENCOUNTER — Other Ambulatory Visit: Payer: Self-pay

## 2021-02-25 VITALS — BP 112/60 | HR 66 | Ht 65.5 in | Wt 215.0 lb

## 2021-02-25 DIAGNOSIS — I10 Essential (primary) hypertension: Secondary | ICD-10-CM | POA: Diagnosis not present

## 2021-02-25 DIAGNOSIS — E785 Hyperlipidemia, unspecified: Secondary | ICD-10-CM | POA: Diagnosis not present

## 2021-02-25 DIAGNOSIS — I480 Paroxysmal atrial fibrillation: Secondary | ICD-10-CM

## 2021-02-25 DIAGNOSIS — M1990 Unspecified osteoarthritis, unspecified site: Secondary | ICD-10-CM | POA: Diagnosis not present

## 2021-02-25 NOTE — Addendum Note (Signed)
Addended by: Carylon Perches on: 02/25/2021 02:32 PM   Modules accepted: Orders

## 2021-02-25 NOTE — Progress Notes (Signed)
Cardiology Office Note:    Date:  02/25/2021   ID:  Latasha Williams, DOB 10-21-1940, MRN [id number]  PCP:  Penelope Coop, FNP  Cardiologist:  Jenne Campus, MD    Referring MD: Penelope Coop, FNP   Chief Complaint  Patient presents with   Follow-up  I am doing fine  History of Present Illness:    Latasha Williams is a 81 y.o. female with past medical history significant for essential hypertension, paroxysmal atrial fibrillation, dyslipidemia she is being followed by my partner previously however now she cannot be patient my practice.  Overall she is doing well.  She denies have any chest pain tightness squeezing pressure burning chest no palpitations no dizziness no swelling of lower extremities except sometimes at evening time.  She does have extensive arthritis in her legs and she did have multiple surgeries on her knees.  Denies have any palpitations no dizziness no passing out.  Past Medical History:  Diagnosis Date   Arthritis    GERD (gastroesophageal reflux disease)    History of skin cancer    BASAL CELL   Hypertension    Restless leg syndrome    OCCASIONAL PROBLEM ONLY    Past Surgical History:  Procedure Laterality Date   BREAST CYST EXCISION     bilateral  benign   CHOLECYSTECTOMY  2002   DILATION AND CURETTAGE OF UTERUS  2013   TOTAL KNEE ARTHROPLASTY Right 05/21/2012   Procedure: TOTAL KNEE ARTHROPLASTY;  Surgeon: Gearlean Alf, MD;  Location: [facility];  Service: Orthopedics;  Laterality: Right;    Current Medications: Current Meds  Medication Sig   acetaminophen (TYLENOL) 325 MG tablet Take 650 mg by mouth every 6 (six) hours as needed for mild pain or moderate pain.   apixaban (ELIQUIS) 5 MG TABS tablet Take 1 tablet (5 mg total) by mouth 2 (two) times daily.   atenolol-chlorthalidone (TENORETIC) 50-25 MG per tablet Take 0.5 tablets by mouth daily before breakfast.    benzonatate (TESSALON) 200 MG capsule Take 200 mg by mouth 3 (three) times daily as  needed for cough.   cephALEXin (KEFLEX) 250 MG capsule Take 250 mg by mouth daily.   chlorpheniramine (CHLOR-TRIMETON) 4 MG tablet Take 4 mg by mouth 2 (two) times daily as needed for allergies.   ciprofloxacin (CIPRO) 250 MG tablet Take 250 mg by mouth daily with breakfast.   Folic Acid-Cholecalciferol 1-500 MG-UNIT TABS Take 1 mg by mouth daily.   methocarbamol (ROBAXIN) 500 MG tablet Take 1 tablet (500 mg total) by mouth every 6 (six) hours as needed. (Patient taking differently: Take 500 mg by mouth every 6 (six) hours as needed for muscle spasms.)   Multiple Vitamins-Minerals (B COMPLEX-C-E-ZINC) tablet Take 1 tablet by mouth daily. Unknown strenght   omeprazole (PRILOSEC) 20 MG capsule Take 20 mg by mouth daily.   ondansetron (ZOFRAN) 4 MG tablet Take 1 tablet (4 mg total) by mouth every 6 (six) hours as needed for nausea.   potassium chloride SA (KLOR-CON) 20 MEQ tablet Take 2 tablets (40 mEq total) by mouth 2 (two) times daily.   pramipexole (MIRAPEX) 0.5 MG tablet Take 1 tablet by mouth at bedtime.   torsemide (DEMADEX) 20 MG tablet Take 1 tablet (20 mg total) by mouth every other day.   traMADol (ULTRAM) 50 MG tablet Take 50-100 mg by mouth every 6 (six) hours as needed for moderate pain or severe pain.   Vitamin D, Ergocalciferol, (DRISDOL) 1.25 MG (50000 UNIT)  CAPS capsule Take 1 capsule (50,000 Units total) by mouth every 7 (seven) days. Once weekly.   [DISCONTINUED] acetaminophen (TYLENOL) 325 MG tablet Take 2 tablets (650 mg total) by mouth every 6 (six) hours as needed. (Patient taking differently: Take 650 mg by mouth every 6 (six) hours as needed for mild pain or moderate pain.)   [DISCONTINUED] traMADol (ULTRAM) 50 MG tablet Take 1-2 tablets (50-100 mg total) by mouth every 6 (six) hours as needed (mild pain).     Allergies:   Patient has no known allergies.   Social History   Socioeconomic History   Marital status: Unknown    Spouse name: Not on file   Number of children:  Not on file   Years of education: Not on file   Highest education level: Not on file  Occupational History   Not on file  Tobacco Use   Smoking status: Never   Smokeless tobacco: Never  Substance and Sexual Activity   Alcohol use: No   Drug use: No   Sexual activity: Not Currently  Other Topics Concern   Not on file  Social History Narrative   Not on file   Social Determinants of Health   Financial Resource Strain: Not on file  Food Insecurity: Not on file  Transportation Needs: Not on file  Physical Activity: Not on file  Stress: Not on file  Social Connections: Not on file     Family History: The patient's family history includes Colon polyps in her sister; Congestive Heart Failure in her mother. ROS:   Please see the history of present illness.    All 14 point review of systems negative except as described per history of present illness  EKGs/Labs/Other Studies Reviewed:      Recent Labs: 07/08/2020: BUN 17; Creatinine, Ser 1.03; Magnesium 2.1; Potassium 3.0; Sodium 137  Recent Lipid Panel No results found for: CHOL, TRIG, HDL, CHOLHDL, VLDL, LDLCALC, LDLDIRECT  Physical Exam:    VS:  BP 112/60 (BP Location: Left Arm, Patient Position: Sitting)    Pulse 66    Ht 5' 5.5" (1.664 m)    Wt 215 lb (97.5 kg)    SpO2 96%    BMI 35.23 kg/m     Wt Readings from Last 3 Encounters:  02/25/21 215 lb (97.5 kg)  05/22/20 212 lb (96.2 kg)  02/24/20 211 lb 12.8 oz (96.1 kg)     GEN:  Well nourished, well developed in no acute distress HEENT: Normal NECK: No JVD; No carotid bruits LYMPHATICS: No lymphadenopathy CARDIAC: RRR, no murmurs, no rubs, no gallops RESPIRATORY:  Clear to auscultation without rales, wheezing or rhonchi  ABDOMEN: Soft, non-tender, non-distended MUSCULOSKELETAL:  No edema; No deformity  SKIN: Warm and dry LOWER EXTREMITIES: no swelling NEUROLOGIC:  Alert and oriented x 3 PSYCHIATRIC:  Normal affect   ASSESSMENT:    1. PAF (paroxysmal atrial  fibrillation) ([place])   2. Arthritis   3. Primary hypertension   4. Dyslipidemia    PLAN:    In order of problems listed above:  Paroxysmal atrial fibrillation.  She is maintaining sinus rhythm denies having a palpitations.  She previously refused any antiarrhythmic therapy still does not want to take an antiarrhythmic therapy in the matter of fact we have to discuss again the reason for her Eliquis.  She is reluctant to take medication especially because of high price of it but now willing to continue. History of mitral regurgitation I will ask her to have echocardiogram performed again.  Essential hypertension blood pressure well controlled continue present medications.  Dyslipidemia I did review her K PN which show me LDL of 107 HDL 51.  This is from 12/07/2020.  She is reluctant to add any medications to her medical regimen already.  Therefore we will continue present management   Medication Adjustments/Labs and Tests Ordered: Current medicines are reviewed at length with the patient today.  Concerns regarding medicines are outlined above.  No orders of the defined types were placed in this encounter.  Medication changes: No orders of the defined types were placed in this encounter.   Signed, Park Liter, MD, [facility] 02/25/2021 2:24 PM    [place]

## 2021-02-25 NOTE — Patient Instructions (Signed)
Medication Instructions:  Your physician recommends that you continue on your current medications as directed. Please refer to the Current Medication list given to you today.  *If you need a refill on your cardiac medications before your next appointment, please call your pharmacy*   Lab Work: None If you have labs (blood work) drawn today and your tests are completely normal, you will receive your results only by: [place] (if you have MyChart) OR A paper copy in the mail If you have any lab test that is abnormal or we need to change your treatment, we will call you to review the results.   Testing/Procedures: Your physician has requested that you have an echocardiogram. Echocardiography is a painless test that uses sound waves to create images of your heart. It provides your doctor with information about the size and shape of your heart and how well your hearts chambers and valves are working. This procedure takes approximately one hour. There are no restrictions for this procedure.   Follow-Up: At [facility], you and your health needs are our priority.  As part of our continuing mission to provide you with exceptional heart care, we have created designated Provider Care Teams.  These Care Teams include your primary Cardiologist (physician) and Advanced Practice Providers (APPs -  Physician Assistants and Nurse Practitioners) who all work together to provide you with the care you need, when you need it.  We recommend signing up for the patient portal called "MyChart".  Sign up information is provided on this After Visit Summary.  MyChart is used to connect with patients for Virtual Visits (Telemedicine).  Patients are able to view lab/test results, encounter notes, upcoming appointments, etc.  Non-urgent messages can be sent to your provider as well.   To learn more about what you can do with MyChart, go to [email or web address].    Your next appointment:   6 month(s)  The  format for your next appointment:   In Person  Provider:   Jenne Campus, MD

## 2021-03-04 ENCOUNTER — Telehealth: Payer: Self-pay | Admitting: Cardiology

## 2021-03-04 MED ORDER — POTASSIUM CHLORIDE CRYS ER 20 MEQ PO TBCR: 40.0000 meq | EXTENDED_RELEASE_TABLET | Freq: Two times a day (BID) | ORAL | 3 refills | Status: DC

## 2021-03-04 NOTE — Telephone Encounter (Signed)
Refill sent in per request.  

## 2021-03-04 NOTE — Telephone Encounter (Signed)
°*  STAT* If patient is at the pharmacy, call can be transferred to refill team.   1. Which medications need to be refilled? (please list name of each medication and dose if known) Potassium Chloride  2. Which pharmacy/location (including street and city if local pharmacy) is medication to be sent to? [facility], [place]   3. Do they need a 30 day or 90 day supply? 90 days #360 and refills

## 2021-03-08 ENCOUNTER — Ambulatory Visit (INDEPENDENT_AMBULATORY_CARE_PROVIDER_SITE_OTHER): Payer: PPO

## 2021-03-08 ENCOUNTER — Other Ambulatory Visit: Payer: Self-pay

## 2021-03-08 ENCOUNTER — Telehealth: Payer: Self-pay

## 2021-03-08 DIAGNOSIS — E785 Hyperlipidemia, unspecified: Secondary | ICD-10-CM

## 2021-03-08 DIAGNOSIS — I480 Paroxysmal atrial fibrillation: Secondary | ICD-10-CM

## 2021-03-08 DIAGNOSIS — M1990 Unspecified osteoarthritis, unspecified site: Secondary | ICD-10-CM | POA: Diagnosis not present

## 2021-03-08 DIAGNOSIS — I10 Essential (primary) hypertension: Secondary | ICD-10-CM

## 2021-03-08 LAB — ECHOCARDIOGRAM COMPLETE
Area-P 1/2: 4.39 cm2
S' Lateral: 3 cm

## 2021-03-08 NOTE — Telephone Encounter (Signed)
Patient came in stating Dr. Agustin Cree said she could have some Eliquis samples.  I gave her samples and a patient assistance form for her to fill out as well.

## 2021-03-10 DIAGNOSIS — I129 Hypertensive chronic kidney disease with stage 1 through stage 4 chronic kidney disease, or unspecified chronic kidney disease: Secondary | ICD-10-CM | POA: Diagnosis not present

## 2021-03-10 DIAGNOSIS — R7303 Prediabetes: Secondary | ICD-10-CM | POA: Diagnosis not present

## 2021-03-10 DIAGNOSIS — K219 Gastro-esophageal reflux disease without esophagitis: Secondary | ICD-10-CM | POA: Diagnosis not present

## 2021-03-10 DIAGNOSIS — Z6836 Body mass index (BMI) 36.0-36.9, adult: Secondary | ICD-10-CM | POA: Diagnosis not present

## 2021-03-10 DIAGNOSIS — I471 Supraventricular tachycardia: Secondary | ICD-10-CM | POA: Diagnosis not present

## 2021-03-10 DIAGNOSIS — N1831 Chronic kidney disease, stage 3a: Secondary | ICD-10-CM | POA: Diagnosis not present

## 2021-03-10 DIAGNOSIS — E782 Mixed hyperlipidemia: Secondary | ICD-10-CM | POA: Diagnosis not present

## 2021-03-10 DIAGNOSIS — I4891 Unspecified atrial fibrillation: Secondary | ICD-10-CM | POA: Diagnosis not present

## 2021-03-11 ENCOUNTER — Telehealth: Payer: Self-pay | Admitting: Cardiology

## 2021-03-11 NOTE — Telephone Encounter (Signed)
Informed patient of results

## 2021-03-11 NOTE — Telephone Encounter (Signed)
Patient called for her Echo results.  

## 2021-03-12 NOTE — Telephone Encounter (Signed)
Patient is calling back wanting to know if the results she was given yesterday by RN are serious and if there is anything that can be done now.

## 2021-03-12 NOTE — Telephone Encounter (Signed)
Results reviewed with pt as per Dr. Krasowski's note.  Pt verbalized understanding and had no additional questions. Routed to PCP  

## 2021-05-05 DIAGNOSIS — M25511 Pain in right shoulder: Secondary | ICD-10-CM | POA: Diagnosis not present

## 2021-05-17 DIAGNOSIS — N390 Urinary tract infection, site not specified: Secondary | ICD-10-CM | POA: Diagnosis not present

## 2021-06-07 DIAGNOSIS — J0100 Acute maxillary sinusitis, unspecified: Secondary | ICD-10-CM | POA: Diagnosis not present

## 2021-06-07 DIAGNOSIS — Z6834 Body mass index (BMI) 34.0-34.9, adult: Secondary | ICD-10-CM | POA: Diagnosis not present

## 2021-06-14 DIAGNOSIS — K219 Gastro-esophageal reflux disease without esophagitis: Secondary | ICD-10-CM | POA: Diagnosis not present

## 2021-06-14 DIAGNOSIS — Z6835 Body mass index (BMI) 35.0-35.9, adult: Secondary | ICD-10-CM | POA: Diagnosis not present

## 2021-06-14 DIAGNOSIS — R7303 Prediabetes: Secondary | ICD-10-CM | POA: Diagnosis not present

## 2021-06-14 DIAGNOSIS — I471 Supraventricular tachycardia: Secondary | ICD-10-CM | POA: Diagnosis not present

## 2021-06-14 DIAGNOSIS — I129 Hypertensive chronic kidney disease with stage 1 through stage 4 chronic kidney disease, or unspecified chronic kidney disease: Secondary | ICD-10-CM | POA: Diagnosis not present

## 2021-06-14 DIAGNOSIS — I4891 Unspecified atrial fibrillation: Secondary | ICD-10-CM | POA: Diagnosis not present

## 2021-06-14 DIAGNOSIS — N1831 Chronic kidney disease, stage 3a: Secondary | ICD-10-CM | POA: Diagnosis not present

## 2021-06-14 DIAGNOSIS — E782 Mixed hyperlipidemia: Secondary | ICD-10-CM | POA: Diagnosis not present

## 2021-06-16 ENCOUNTER — Other Ambulatory Visit: Payer: Self-pay

## 2021-06-16 MED ORDER — APIXABAN 5 MG PO TABS: 5.0000 mg | ORAL_TABLET | Freq: Two times a day (BID) | ORAL | 1 refills | Status: DC

## 2021-06-16 NOTE — Telephone Encounter (Signed)
Prescription refill request for Eliquis received. ?Indication:Afib ?Last office visit:1/23 ?Scr:0.9 ?Age: 81 ?Weight:97.5 kg ? ?Prescription refilled ? ?

## 2021-06-28 DIAGNOSIS — K5900 Constipation, unspecified: Secondary | ICD-10-CM | POA: Diagnosis not present

## 2021-06-28 DIAGNOSIS — N3090 Cystitis, unspecified without hematuria: Secondary | ICD-10-CM | POA: Diagnosis not present

## 2021-06-28 DIAGNOSIS — N952 Postmenopausal atrophic vaginitis: Secondary | ICD-10-CM | POA: Diagnosis not present

## 2021-09-06 ENCOUNTER — Ambulatory Visit: Payer: PPO | Admitting: Cardiology

## 2021-09-15 ENCOUNTER — Ambulatory Visit: Payer: PPO | Admitting: Cardiology

## 2021-09-15 ENCOUNTER — Encounter: Payer: Self-pay | Admitting: Cardiology

## 2021-09-15 VITALS — BP 130/70 | HR 57 | Ht 65.5 in | Wt 210.8 lb

## 2021-09-15 DIAGNOSIS — I10 Essential (primary) hypertension: Secondary | ICD-10-CM

## 2021-09-15 DIAGNOSIS — E785 Hyperlipidemia, unspecified: Secondary | ICD-10-CM | POA: Diagnosis not present

## 2021-09-15 DIAGNOSIS — I480 Paroxysmal atrial fibrillation: Secondary | ICD-10-CM

## 2021-09-15 NOTE — Patient Instructions (Signed)

## 2021-09-15 NOTE — Progress Notes (Unsigned)
Cardiology Office Note:    Date:  09/15/2021   ID:  Latasha Williams, DOB 08-10-1940, MRN [id number]  PCP:  Darrol Jump, PA-C  Cardiologist:  Jenne Campus, MD    Referring MD: Penelope Coop, FNP   Chief Complaint  Patient presents with   Follow-up  Doing well  History of Present Illness:    Latasha Williams is a 81 y.o. female with past medical history significant for paroxysmal atrial fibrillation, essential hypertension, dyslipidemia.  She is in my office today for follow-up.  Overall she says she is doing well.  She denies have any palpitations there is no chest pain tightness squeezing pressure burning chest.  She tells me that she is very active and she working the garden have to stop from time to time to catch her breath but otherwise doing fine  Past Medical History:  Diagnosis Date   Arthritis    GERD (gastroesophageal reflux disease)    History of skin cancer    BASAL CELL   Hypertension    Restless leg syndrome    OCCASIONAL PROBLEM ONLY    Past Surgical History:  Procedure Laterality Date   BREAST CYST EXCISION     bilateral  benign   CHOLECYSTECTOMY  2002   DILATION AND CURETTAGE OF UTERUS  2013   TOTAL KNEE ARTHROPLASTY Right 05/21/2012   Procedure: TOTAL KNEE ARTHROPLASTY;  Surgeon: Gearlean Alf, MD;  Location: [facility];  Service: Orthopedics;  Laterality: Right;    Current Medications: Current Meds  Medication Sig   acetaminophen (TYLENOL) 325 MG tablet Take 650 mg by mouth every 6 (six) hours as needed for mild pain or moderate pain.   apixaban (ELIQUIS) 5 MG TABS tablet Take 1 tablet (5 mg total) by mouth 2 (two) times daily.   atenolol-chlorthalidone (TENORETIC) 50-25 MG per tablet Take 0.5 tablets by mouth daily before breakfast.    benzonatate (TESSALON) 200 MG capsule Take 200 mg by mouth 3 (three) times daily as needed for cough.   cephALEXin (KEFLEX) 250 MG capsule Take 250 mg by mouth daily.   chlorpheniramine (CHLOR-TRIMETON) 4 MG tablet  Take 4 mg by mouth 2 (two) times daily as needed for allergies.   ciprofloxacin (CIPRO) 250 MG tablet Take 250 mg by mouth daily with breakfast.   Folic Acid-Cholecalciferol 1-500 MG-UNIT TABS Take 1 mg by mouth daily.   methocarbamol (ROBAXIN) 500 MG tablet Take 1 tablet (500 mg total) by mouth every 6 (six) hours as needed. (Patient taking differently: Take 500 mg by mouth every 6 (six) hours as needed for muscle spasms.)   Multiple Vitamins-Minerals (B COMPLEX-C-E-ZINC) tablet Take 1 tablet by mouth daily. Unknown strenght   omeprazole (PRILOSEC) 20 MG capsule Take 20 mg by mouth daily.   ondansetron (ZOFRAN) 4 MG tablet Take 1 tablet (4 mg total) by mouth every 6 (six) hours as needed for nausea.   potassium chloride SA (KLOR-CON M) 20 MEQ tablet Take 2 tablets (40 mEq total) by mouth 2 (two) times daily.   pramipexole (MIRAPEX) 0.5 MG tablet Take 1 tablet by mouth at bedtime.   torsemide (DEMADEX) 20 MG tablet Take 1 tablet (20 mg total) by mouth every other day.   traMADol (ULTRAM) 50 MG tablet Take 50-100 mg by mouth every 6 (six) hours as needed for moderate pain or severe pain.   Vitamin D, Ergocalciferol, (DRISDOL) 1.25 MG (50000 UNIT) CAPS capsule Take 1 capsule (50,000 Units total) by mouth every 7 (seven) days. Once weekly.  Allergies:   Patient has no known allergies.   Social History   Socioeconomic History   Marital status: Unknown    Spouse name: Not on file   Number of children: Not on file   Years of education: Not on file   Highest education level: Not on file  Occupational History   Not on file  Tobacco Use   Smoking status: Never   Smokeless tobacco: Never  Substance and Sexual Activity   Alcohol use: No   Drug use: No   Sexual activity: Not Currently  Other Topics Concern   Not on file  Social History Narrative   Not on file   Social Determinants of Health   Financial Resource Strain: Not on file  Food Insecurity: Not on file  Transportation Needs:  Not on file  Physical Activity: Not on file  Stress: Not on file  Social Connections: Not on file     Family History: The patient's family history includes Colon polyps in her sister; Congestive Heart Failure in her mother. ROS:   Please see the history of present illness.    All 14 point review of systems negative except as described per history of present illness  EKGs/Labs/Other Studies Reviewed:      Recent Labs: No results found for requested labs within last 365 days.  Recent Lipid Panel No results found for: "CHOL", "TRIG", "HDL", "CHOLHDL", "VLDL", "LDLCALC", "LDLDIRECT"  Physical Exam:    VS:  BP 130/70 (BP Location: Left Arm, Patient Position: Sitting)   Pulse (!) 57   Ht 5' 5.5" (1.664 m)   Wt 210 lb 12.8 oz (95.6 kg)   SpO2 95%   BMI 34.55 kg/m     Wt Readings from Last 3 Encounters:  09/15/21 210 lb 12.8 oz (95.6 kg)  02/25/21 215 lb (97.5 kg)  05/22/20 212 lb (96.2 kg)     GEN:  Well nourished, well developed in no acute distress HEENT: Normal NECK: No JVD; No carotid bruits LYMPHATICS: No lymphadenopathy CARDIAC: RRR, no murmurs, no rubs, no gallops RESPIRATORY:  Clear to auscultation without rales, wheezing or rhonchi  ABDOMEN: Soft, non-tender, non-distended MUSCULOSKELETAL:  No edema; No deformity  SKIN: Warm and dry LOWER EXTREMITIES: no swelling NEUROLOGIC:  Alert and oriented x 3 PSYCHIATRIC:  Normal affect   ASSESSMENT:    1. PAF (paroxysmal atrial fibrillation) ([place])   2. Primary hypertension   3. Dyslipidemia    PLAN:    In order of problems listed above:  Paroxysmal atrial fibrillation seems to maintain sinus rhythm.  She is anticoagulated again we reviewed her medications she does not like to take medication she said that she does not take medication all the time.  I explained to her importance of taking Eliquis how essential it is to prevent her from having stroke hopefully she will continue to take it. Dyslipidemia I did review  K PN which show me LDL of 129 HDL 53 this is from Jun 14, 2021.  I brought an issue of hypercholesterolemia and talk to her about potentially starting medication for it she tells me she does not want to.  She tells me that she does not want to take medication she does not want it. Essential hypertension likely blood pressure seems to well controlled we will continue present management. Mitral regurgitation only mild based on last echocardiogram we will continue monitoring   Medication Adjustments/Labs and Tests Ordered: Current medicines are reviewed at length with the patient today.  Concerns regarding medicines  are outlined above.  No orders of the defined types were placed in this encounter.  Medication changes: No orders of the defined types were placed in this encounter.   Signed, Park Liter, MD, [facility] 09/15/2021 9:07 AM    [place]

## 2021-09-20 DIAGNOSIS — I4891 Unspecified atrial fibrillation: Secondary | ICD-10-CM | POA: Diagnosis not present

## 2021-09-20 DIAGNOSIS — K219 Gastro-esophageal reflux disease without esophagitis: Secondary | ICD-10-CM | POA: Diagnosis not present

## 2021-09-20 DIAGNOSIS — R7303 Prediabetes: Secondary | ICD-10-CM | POA: Diagnosis not present

## 2021-09-20 DIAGNOSIS — I471 Supraventricular tachycardia: Secondary | ICD-10-CM | POA: Diagnosis not present

## 2021-09-20 DIAGNOSIS — Z6835 Body mass index (BMI) 35.0-35.9, adult: Secondary | ICD-10-CM | POA: Diagnosis not present

## 2021-09-20 DIAGNOSIS — E782 Mixed hyperlipidemia: Secondary | ICD-10-CM | POA: Diagnosis not present

## 2021-09-20 DIAGNOSIS — N390 Urinary tract infection, site not specified: Secondary | ICD-10-CM | POA: Diagnosis not present

## 2021-09-20 DIAGNOSIS — N1831 Chronic kidney disease, stage 3a: Secondary | ICD-10-CM | POA: Diagnosis not present

## 2021-09-20 DIAGNOSIS — I129 Hypertensive chronic kidney disease with stage 1 through stage 4 chronic kidney disease, or unspecified chronic kidney disease: Secondary | ICD-10-CM | POA: Diagnosis not present

## 2021-11-02 DIAGNOSIS — Z6834 Body mass index (BMI) 34.0-34.9, adult: Secondary | ICD-10-CM | POA: Diagnosis not present

## 2021-11-02 DIAGNOSIS — N3000 Acute cystitis without hematuria: Secondary | ICD-10-CM | POA: Diagnosis not present

## 2021-11-02 DIAGNOSIS — R350 Frequency of micturition: Secondary | ICD-10-CM | POA: Diagnosis not present

## 2021-11-02 DIAGNOSIS — N390 Urinary tract infection, site not specified: Secondary | ICD-10-CM | POA: Diagnosis not present

## 2021-12-03 DIAGNOSIS — Z6834 Body mass index (BMI) 34.0-34.9, adult: Secondary | ICD-10-CM | POA: Diagnosis not present

## 2021-12-03 DIAGNOSIS — R350 Frequency of micturition: Secondary | ICD-10-CM | POA: Diagnosis not present

## 2021-12-03 DIAGNOSIS — N390 Urinary tract infection, site not specified: Secondary | ICD-10-CM | POA: Diagnosis not present

## 2021-12-23 DIAGNOSIS — N3090 Cystitis, unspecified without hematuria: Secondary | ICD-10-CM | POA: Diagnosis not present

## 2021-12-23 DIAGNOSIS — K5900 Constipation, unspecified: Secondary | ICD-10-CM | POA: Diagnosis not present

## 2021-12-23 DIAGNOSIS — N952 Postmenopausal atrophic vaginitis: Secondary | ICD-10-CM | POA: Diagnosis not present

## 2021-12-23 DIAGNOSIS — N3289 Other specified disorders of bladder: Secondary | ICD-10-CM | POA: Diagnosis not present

## 2021-12-23 DIAGNOSIS — N390 Urinary tract infection, site not specified: Secondary | ICD-10-CM | POA: Diagnosis not present

## 2021-12-27 DIAGNOSIS — I129 Hypertensive chronic kidney disease with stage 1 through stage 4 chronic kidney disease, or unspecified chronic kidney disease: Secondary | ICD-10-CM | POA: Diagnosis not present

## 2021-12-27 DIAGNOSIS — R7303 Prediabetes: Secondary | ICD-10-CM | POA: Diagnosis not present

## 2021-12-27 DIAGNOSIS — I4891 Unspecified atrial fibrillation: Secondary | ICD-10-CM | POA: Diagnosis not present

## 2021-12-27 DIAGNOSIS — Z9181 History of falling: Secondary | ICD-10-CM | POA: Diagnosis not present

## 2021-12-27 DIAGNOSIS — E782 Mixed hyperlipidemia: Secondary | ICD-10-CM | POA: Diagnosis not present

## 2021-12-27 DIAGNOSIS — N1831 Chronic kidney disease, stage 3a: Secondary | ICD-10-CM | POA: Diagnosis not present

## 2021-12-27 DIAGNOSIS — Z6835 Body mass index (BMI) 35.0-35.9, adult: Secondary | ICD-10-CM | POA: Diagnosis not present

## 2022-01-25 ENCOUNTER — Encounter: Payer: Self-pay | Admitting: Cardiology

## 2022-01-25 ENCOUNTER — Ambulatory Visit: Payer: PPO | Attending: Cardiology | Admitting: Cardiology

## 2022-01-25 VITALS — BP 120/70 | HR 56 | Ht 65.5 in | Wt 210.6 lb

## 2022-01-25 DIAGNOSIS — I480 Paroxysmal atrial fibrillation: Secondary | ICD-10-CM | POA: Diagnosis not present

## 2022-01-25 DIAGNOSIS — E785 Hyperlipidemia, unspecified: Secondary | ICD-10-CM

## 2022-01-25 DIAGNOSIS — I10 Essential (primary) hypertension: Secondary | ICD-10-CM

## 2022-01-25 NOTE — Progress Notes (Unsigned)
Cardiology Office Note:    Date:  01/25/2022   ID:  Latasha Williams, DOB 12/23/1940, MRN [id number]  PCP:  Darrol Jump, PA-C  Cardiologist:  Jenne Campus, MD    Referring MD: Darrol Jump, PA-C   Chief Complaint  Patient presents with   Atrial Fibrillation    History of Present Illness:    Latasha Williams is a 81 y.o. female past medical history significant for paroxysmal atrial fibrillation, essential hypertension, dyslipidemia.  She is in my office today for follow-up.  She described to have what she calls panic attacks she said she could be sitting and suddenly her heart will start spitting up.  She usually takes Xanax for this with good relief.  I wanted her to wear a monitor to make sure she does not have paroxysmal of atrial fibrillation, however, she does not want to do that.  Otherwise she is doing fine.  She can walk pedal around and doing well.  Past Medical History:  Diagnosis Date   Arthritis    GERD (gastroesophageal reflux disease)    History of skin cancer    BASAL CELL   Hypertension    Restless leg syndrome    OCCASIONAL PROBLEM ONLY    Past Surgical History:  Procedure Laterality Date   BREAST CYST EXCISION     bilateral  benign   CHOLECYSTECTOMY  2002   DILATION AND CURETTAGE OF UTERUS  2013   TOTAL KNEE ARTHROPLASTY Right 05/21/2012   Procedure: TOTAL KNEE ARTHROPLASTY;  Surgeon: Gearlean Alf, MD;  Location: [facility];  Service: Orthopedics;  Laterality: Right;    Current Medications: Current Meds  Medication Sig   acetaminophen (TYLENOL) 325 MG tablet Take 650 mg by mouth every 6 (six) hours as needed for mild pain or moderate pain.   apixaban (ELIQUIS) 5 MG TABS tablet Take 1 tablet (5 mg total) by mouth 2 (two) times daily.   atenolol-chlorthalidone (TENORETIC) 50-25 MG per tablet Take 0.5 tablets by mouth daily before breakfast.    cephALEXin (KEFLEX) 250 MG capsule Take 250 mg by mouth daily.   ciprofloxacin (CIPRO) 250 MG tablet Take  250 mg by mouth daily with breakfast.   Folic Acid-Cholecalciferol 1-500 MG-UNIT TABS Take 1 mg by mouth daily.   methocarbamol (ROBAXIN) 500 MG tablet Take 1 tablet (500 mg total) by mouth every 6 (six) hours as needed. (Patient taking differently: Take 500 mg by mouth every 6 (six) hours as needed for muscle spasms.)   Multiple Vitamins-Minerals (B COMPLEX-C-E-ZINC) tablet Take 1 tablet by mouth daily. Unknown strenght   omeprazole (PRILOSEC) 20 MG capsule Take 20 mg by mouth daily.   ondansetron (ZOFRAN) 4 MG tablet Take 1 tablet (4 mg total) by mouth every 6 (six) hours as needed for nausea.   potassium chloride SA (KLOR-CON M) 20 MEQ tablet Take 2 tablets (40 mEq total) by mouth 2 (two) times daily.   pramipexole (MIRAPEX) 0.5 MG tablet Take 1 tablet by mouth at bedtime.   torsemide (DEMADEX) 20 MG tablet Take 1 tablet (20 mg total) by mouth every other day.   traMADol (ULTRAM) 50 MG tablet Take 50-100 mg by mouth every 6 (six) hours as needed for moderate pain or severe pain.   Vitamin D, Ergocalciferol, (DRISDOL) 1.25 MG (50000 UNIT) CAPS capsule Take 1 capsule (50,000 Units total) by mouth every 7 (seven) days. Once weekly.   [DISCONTINUED] benzonatate (TESSALON) 200 MG capsule Take 200 mg by mouth 3 (three) times daily as needed  for cough.   [DISCONTINUED] chlorpheniramine (CHLOR-TRIMETON) 4 MG tablet Take 4 mg by mouth 2 (two) times daily as needed for allergies.     Allergies:   Patient has no known allergies.   Social History   Socioeconomic History   Marital status: Unknown    Spouse name: Not on file   Number of children: Not on file   Years of education: Not on file   Highest education level: Not on file  Occupational History   Not on file  Tobacco Use   Smoking status: Never   Smokeless tobacco: Never  Substance and Sexual Activity   Alcohol use: No   Drug use: No   Sexual activity: Not Currently  Other Topics Concern   Not on file  Social History Narrative   Not  on file   Social Determinants of Health   Financial Resource Strain: Not on file  Food Insecurity: Not on file  Transportation Needs: Not on file  Physical Activity: Not on file  Stress: Not on file  Social Connections: Not on file     Family History: The patient's family history includes Colon polyps in her sister; Congestive Heart Failure in her mother. ROS:   Please see the history of present illness.    All 14 point review of systems negative except as described per history of present illness  EKGs/Labs/Other Studies Reviewed:      Recent Labs: No results found for requested labs within last 365 days.  Recent Lipid Panel No results found for: "CHOL", "TRIG", "HDL", "CHOLHDL", "VLDL", "LDLCALC", "LDLDIRECT"  Physical Exam:    VS:  BP 120/70 (BP Location: Left Arm, Patient Position: Sitting)   Pulse (!) 56   Ht 5' 5.5" (1.664 m)   Wt 210 lb 9.6 oz (95.5 kg)   SpO2 94%   BMI 34.51 kg/m     Wt Readings from Last 3 Encounters:  01/25/22 210 lb 9.6 oz (95.5 kg)  09/15/21 210 lb 12.8 oz (95.6 kg)  02/25/21 215 lb (97.5 kg)     GEN:  Well nourished, well developed in no acute distress HEENT: Normal NECK: No JVD; No carotid bruits LYMPHATICS: No lymphadenopathy CARDIAC: RRR, no murmurs, no rubs, no gallops RESPIRATORY:  Clear to auscultation without rales, wheezing or rhonchi  ABDOMEN: Soft, non-tender, non-distended MUSCULOSKELETAL:  No edema; No deformity  SKIN: Warm and dry LOWER EXTREMITIES: no swelling NEUROLOGIC:  Alert and oriented x 3 PSYCHIATRIC:  Normal affect   ASSESSMENT:    1. PAF (paroxysmal atrial fibrillation) ([place])   2. Primary hypertension   3. Dyslipidemia    PLAN:    In order of problems listed above:  Palpitations.  I suspect she may have some breakthrough of atrial fibrillation with rapid monitor on her she does not want to.  She is convinced this is finding her back which may she may be right.  In the meantime we will continue  anticoagulation. Dyslipidemia I did review K PN which only her LDL of 111, HDL 53 again and brought issue off of taking medication for cholesterol she does not want to do that she is scared of cholesterol medication. Essential hypertension, blood pressure seems to well-controlled today we will continue present management.   Medication Adjustments/Labs and Tests Ordered: Current medicines are reviewed at length with the patient today.  Concerns regarding medicines are outlined above.  No orders of the defined types were placed in this encounter.  Medication changes: No orders of the defined types were  placed in this encounter.   Signed, Park Liter, MD, [facility] 01/25/2022 11:01 AM    [place]

## 2022-01-25 NOTE — Patient Instructions (Signed)

## 2022-01-28 ENCOUNTER — Telehealth: Payer: Self-pay

## 2022-01-28 ENCOUNTER — Telehealth: Payer: Self-pay | Admitting: Cardiology

## 2022-01-28 DIAGNOSIS — I480 Paroxysmal atrial fibrillation: Secondary | ICD-10-CM

## 2022-01-28 NOTE — Telephone Encounter (Signed)
Patient stated she would like to get a heart monitor.

## 2022-01-28 NOTE — Telephone Encounter (Signed)
Zio 14 day entered.

## 2022-01-28 NOTE — Telephone Encounter (Signed)
Will schedule to come in Monday 01-31-22 for Zio 14 day per pt request. She did not want monitor at recent appt and decided to wear it.

## 2022-01-31 ENCOUNTER — Ambulatory Visit: Payer: PPO | Attending: Cardiology

## 2022-01-31 DIAGNOSIS — I480 Paroxysmal atrial fibrillation: Secondary | ICD-10-CM | POA: Diagnosis not present

## 2022-02-15 DIAGNOSIS — Z6834 Body mass index (BMI) 34.0-34.9, adult: Secondary | ICD-10-CM | POA: Diagnosis not present

## 2022-02-15 DIAGNOSIS — J208 Acute bronchitis due to other specified organisms: Secondary | ICD-10-CM | POA: Diagnosis not present

## 2022-02-21 DIAGNOSIS — I480 Paroxysmal atrial fibrillation: Secondary | ICD-10-CM | POA: Diagnosis not present

## 2022-02-23 ENCOUNTER — Telehealth: Payer: Self-pay | Admitting: Cardiology

## 2022-02-23 NOTE — Telephone Encounter (Signed)
Called the patient and informed her that Dr. Agustin Cree still has to interpret the results of the heart monitor.. I asked her to call back in a couple of days. Patient stated she thought it might be too early to get the results and said that she would call back in a couple of days. Patient had no further questions at this time.

## 2022-02-23 NOTE — Telephone Encounter (Signed)
Patient is requesting a call back to discuss monitor results. She is requesting a call back after 12:00 PM if possible because she plans to be out of the house.

## 2022-02-25 ENCOUNTER — Other Ambulatory Visit: Payer: Self-pay

## 2022-02-25 DIAGNOSIS — I480 Paroxysmal atrial fibrillation: Secondary | ICD-10-CM

## 2022-02-25 MED ORDER — APIXABAN 5 MG PO TABS: 5.0000 mg | ORAL_TABLET | Freq: Two times a day (BID) | ORAL | 1 refills | Status: DC

## 2022-02-25 NOTE — Telephone Encounter (Signed)
Prescription refill request for Eliquis received. Indication: Afib  Last office visit:01/25/22 Agustin Cree)  Scr: 1.24 (12/27/21)  Age: 82 Weight: 95.5kg  Appropriate dose and refill sent to requested pharmacy

## 2022-04-04 DIAGNOSIS — I129 Hypertensive chronic kidney disease with stage 1 through stage 4 chronic kidney disease, or unspecified chronic kidney disease: Secondary | ICD-10-CM | POA: Diagnosis not present

## 2022-04-04 DIAGNOSIS — E782 Mixed hyperlipidemia: Secondary | ICD-10-CM | POA: Diagnosis not present

## 2022-04-04 DIAGNOSIS — R7303 Prediabetes: Secondary | ICD-10-CM | POA: Diagnosis not present

## 2022-04-20 ENCOUNTER — Ambulatory Visit (INDEPENDENT_AMBULATORY_CARE_PROVIDER_SITE_OTHER): Payer: PPO

## 2022-04-20 ENCOUNTER — Ambulatory Visit: Payer: PPO | Admitting: Podiatry

## 2022-04-20 DIAGNOSIS — M2041 Other hammer toe(s) (acquired), right foot: Secondary | ICD-10-CM | POA: Diagnosis not present

## 2022-04-20 DIAGNOSIS — L84 Corns and callosities: Secondary | ICD-10-CM

## 2022-04-20 NOTE — Progress Notes (Signed)
  Subjective:  Patient ID: Latasha Williams, female    DOB: [birth year],   MRN: [id number]  Chief Complaint  Patient presents with   Foot Problem    right toe has spot that will heal and then come back    Hammer Toe    Right hammertoe 2nd toe     82 y.o. female presents for concern of right second toe pain and hammertoe. Relates she also has some areas on her big toes that are rubbing and painful especially in her shoes. Relates her right second toe rubs in her shoes as well . Denies any other pedal complaints. Denies n/v/f/c.   Past Medical History:  Diagnosis Date   Arthritis    GERD (gastroesophageal reflux disease)    History of skin cancer    BASAL CELL   Hypertension    Restless leg syndrome    OCCASIONAL PROBLEM ONLY    Objective:  Physical Exam: Vascular: DP/PT pulses 2/4 bilateral. CFT <3 seconds. Normal hair growth on digits. No edema.  Skin. No lacerations or abrasions bilateral feet. Mild hyperkeratosis noted to dorsum of PIPJ of second digit on right and distal medial bilateral hallux.  Musculoskeletal: MMT 5/5 bilateral lower extremities in DF, PF, Inversion and Eversion. Deceased ROM in DF of ankle joint.  Neurological: Sensation intact to light touch.   Assessment:   1. Hammertoe of right foot   2. Pre-ulcerative calluses      Plan:  Patient was evaluated and treated and all questions answered. -X-rays reviewed. Hammered digits 2-5 on the right. No acute fractures or dislocations noted.  -Educated on hammertoes and treatment options  -Discussed padding including toe caps  -Hyperkeratotic lesions debrided without incident as courtesy.   -Discussed need for potential surgery/in office tenotomy if pain does not improved.  -Discussed proper shoe wear and wearing wide toe shoes that are the proper size.  -Patient to follow-up as needed. Discussed calling if any changes or increased pain.    Lorenda Peck, DPM

## 2022-05-02 DIAGNOSIS — K5909 Other constipation: Secondary | ICD-10-CM | POA: Diagnosis not present

## 2022-05-02 DIAGNOSIS — Z6835 Body mass index (BMI) 35.0-35.9, adult: Secondary | ICD-10-CM | POA: Diagnosis not present

## 2022-05-02 DIAGNOSIS — R350 Frequency of micturition: Secondary | ICD-10-CM | POA: Diagnosis not present

## 2022-05-23 DIAGNOSIS — B372 Candidiasis of skin and nail: Secondary | ICD-10-CM | POA: Diagnosis not present

## 2022-05-23 DIAGNOSIS — Z6835 Body mass index (BMI) 35.0-35.9, adult: Secondary | ICD-10-CM | POA: Diagnosis not present

## 2022-06-01 DIAGNOSIS — Z79899 Other long term (current) drug therapy: Secondary | ICD-10-CM | POA: Diagnosis not present

## 2022-06-01 DIAGNOSIS — N134 Hydroureter: Secondary | ICD-10-CM | POA: Diagnosis not present

## 2022-06-01 DIAGNOSIS — N281 Cyst of kidney, acquired: Secondary | ICD-10-CM | POA: Diagnosis not present

## 2022-06-01 DIAGNOSIS — K5792 Diverticulitis of intestine, part unspecified, without perforation or abscess without bleeding: Secondary | ICD-10-CM | POA: Diagnosis not present

## 2022-06-01 DIAGNOSIS — K5732 Diverticulitis of large intestine without perforation or abscess without bleeding: Secondary | ICD-10-CM | POA: Diagnosis not present

## 2022-06-01 DIAGNOSIS — I10 Essential (primary) hypertension: Secondary | ICD-10-CM | POA: Diagnosis not present

## 2022-06-06 ENCOUNTER — Telehealth: Payer: Self-pay

## 2022-06-06 NOTE — Telephone Encounter (Signed)
     Patient  visit on 4/17  at [facility]  Have you been able to follow up with your primary care physician? Yes   The patient was or was not able to obtain any needed medicine or equipment. Yes   Are there diet recommendations that you are having difficulty following? Na   Patient expresses understanding of discharge instructions and education provided has no other needs at this time.  Yes      Lenard Forth [facility] Guide, [facility] [phone] [address] [place], [place], [place] [address] Phone: [phone] Email: [email or web address]

## 2022-06-07 DIAGNOSIS — Z6835 Body mass index (BMI) 35.0-35.9, adult: Secondary | ICD-10-CM | POA: Diagnosis not present

## 2022-06-07 DIAGNOSIS — E876 Hypokalemia: Secondary | ICD-10-CM | POA: Diagnosis not present

## 2022-06-07 DIAGNOSIS — D72829 Elevated white blood cell count, unspecified: Secondary | ICD-10-CM | POA: Diagnosis not present

## 2022-06-07 DIAGNOSIS — K5792 Diverticulitis of intestine, part unspecified, without perforation or abscess without bleeding: Secondary | ICD-10-CM | POA: Diagnosis not present

## 2022-06-07 DIAGNOSIS — Z79899 Other long term (current) drug therapy: Secondary | ICD-10-CM | POA: Diagnosis not present

## 2022-06-21 DIAGNOSIS — Z961 Presence of intraocular lens: Secondary | ICD-10-CM | POA: Diagnosis not present

## 2022-06-27 DIAGNOSIS — K5900 Constipation, unspecified: Secondary | ICD-10-CM | POA: Diagnosis not present

## 2022-06-27 DIAGNOSIS — N3289 Other specified disorders of bladder: Secondary | ICD-10-CM | POA: Diagnosis not present

## 2022-06-27 DIAGNOSIS — N390 Urinary tract infection, site not specified: Secondary | ICD-10-CM | POA: Diagnosis not present

## 2022-06-27 DIAGNOSIS — N952 Postmenopausal atrophic vaginitis: Secondary | ICD-10-CM | POA: Diagnosis not present

## 2022-07-13 DIAGNOSIS — N390 Urinary tract infection, site not specified: Secondary | ICD-10-CM | POA: Diagnosis not present

## 2022-07-20 ENCOUNTER — Other Ambulatory Visit: Payer: Self-pay | Admitting: Cardiology

## 2022-07-20 NOTE — Telephone Encounter (Signed)
Rx to pharmacy

## 2022-07-22 DIAGNOSIS — N390 Urinary tract infection, site not specified: Secondary | ICD-10-CM | POA: Diagnosis not present

## 2022-07-28 ENCOUNTER — Encounter: Payer: Self-pay | Admitting: Cardiology

## 2022-07-28 ENCOUNTER — Ambulatory Visit: Payer: PPO | Attending: Cardiology | Admitting: Cardiology

## 2022-07-28 VITALS — BP 116/72 | HR 60 | Ht 65.0 in | Wt 208.6 lb

## 2022-07-28 DIAGNOSIS — E785 Hyperlipidemia, unspecified: Secondary | ICD-10-CM | POA: Diagnosis not present

## 2022-07-28 DIAGNOSIS — I480 Paroxysmal atrial fibrillation: Secondary | ICD-10-CM

## 2022-07-28 DIAGNOSIS — I10 Essential (primary) hypertension: Secondary | ICD-10-CM

## 2022-07-28 MED ORDER — APIXABAN 5 MG PO TABS: 5.0000 mg | ORAL_TABLET | Freq: Two times a day (BID) | ORAL | 0 refills | Status: DC

## 2022-07-28 MED ORDER — DILTIAZEM HCL 30 MG PO TABS: 30.0000 mg | ORAL_TABLET | Freq: Three times a day (TID) | ORAL | 3 refills | Status: DC | PRN

## 2022-07-28 NOTE — Patient Instructions (Signed)
Medication Instructions:   START: Cardizem 30mg  1 tablet every 8 hours as needed for palpitations.    Lab Work: None Ordered If you have labs (blood work) drawn today and your tests are completely normal, you will receive your results only by: MyChart Message (if you have MyChart) OR A paper copy in the mail If you have any lab test that is abnormal or we need to change your treatment, we will call you to review the results.   Testing/Procedures: None Ordered   Follow-Up: At [facility], you and your health needs are our priority.  As part of our continuing mission to provide you with exceptional heart care, we have created designated Provider Care Teams.  These Care Teams include your primary Cardiologist (physician) and Advanced Practice Providers (APPs -  Physician Assistants and Nurse Practitioners) who all work together to provide you with the care you need, when you need it.  We recommend signing up for the patient portal called "MyChart".  Sign up information is provided on this After Visit Summary.  MyChart is used to connect with patients for Virtual Visits (Telemedicine).  Patients are able to view lab/test results, encounter notes, upcoming appointments, etc.  Non-urgent messages can be sent to your provider as well.   To learn more about what you can do with MyChart, go to [email or web address].    Your next appointment:   6 month(s)  The format for your next appointment:   In Person  Provider:   Gypsy Balsam, MD    Other Instructions NA

## 2022-07-28 NOTE — Progress Notes (Signed)
Cardiology Office Note:    Date:  07/28/2022   ID:  Latasha Williams, DOB 10-25-1940, MRN [id number]  PCP:  Gus Height, PA  Cardiologist:  Gypsy Balsam, MD    Referring MD: Gus Height, [place]   Chief Complaint  Patient presents with   Medication Refill    Eliquis    History of Present Illness:    Latasha Williams is a 82 y.o. female with past medical history significant for paroxysmal atrial fibrillation, essential hypertension, dyslipidemia.  Comes today to months for follow-up she described episode of palpitations.  I did put monitor on her last time which show atrial fibrillation paroxysmal with total burden of about 3%.  I initiated conversation about potentially taking some antiarrhythmic medication what it meant was to start amiodarone to suppress those episodes however she does not want to take any medications denies have any chest pain tightness squeezing pressure burning chest no shortness of breath  Past Medical History:  Diagnosis Date   Arthritis    GERD (gastroesophageal reflux disease)    History of skin cancer    BASAL CELL   Hypertension    Restless leg syndrome    OCCASIONAL PROBLEM ONLY    Past Surgical History:  Procedure Laterality Date   BREAST CYST EXCISION     bilateral  benign   CHOLECYSTECTOMY  2002   DILATION AND CURETTAGE OF UTERUS  2013   TOTAL KNEE ARTHROPLASTY Right 05/21/2012   Procedure: TOTAL KNEE ARTHROPLASTY;  Surgeon: Loanne Drilling, MD;  Location: [facility];  Service: Orthopedics;  Laterality: Right;    Current Medications: Current Meds  Medication Sig   acetaminophen (TYLENOL) 325 MG tablet Take 650 mg by mouth every 6 (six) hours as needed for mild pain or moderate pain.   apixaban (ELIQUIS) 5 MG TABS tablet Take 1 tablet (5 mg total) by mouth 2 (two) times daily.   atenolol-chlorthalidone (TENORETIC) 50-25 MG per tablet Take 0.5 tablets by mouth daily before breakfast.    ciprofloxacin (CIPRO) 250 MG tablet Take 250 mg by mouth  daily with breakfast.   Folic Acid-Cholecalciferol 4-098 MG-UNIT TABS Take 1 mg by mouth daily.   Multiple Vitamins-Minerals (B COMPLEX-C-E-ZINC) tablet Take 1 tablet by mouth daily. Unknown strenght   omeprazole (PRILOSEC) 20 MG capsule Take 20 mg by mouth daily.   ondansetron (ZOFRAN) 4 MG tablet Take 1 tablet (4 mg total) by mouth every 6 (six) hours as needed for nausea.   potassium chloride SA (KLOR-CON M) 20 MEQ tablet Take 2 tablets (40 mEq total) by mouth 2 (two) times daily.   pramipexole (MIRAPEX) 0.5 MG tablet Take 1 tablet by mouth at bedtime.   torsemide (DEMADEX) 20 MG tablet Take 1 tablet (20 mg total) by mouth every other day.   traMADol (ULTRAM) 50 MG tablet Take 50-100 mg by mouth every 6 (six) hours as needed for moderate pain or severe pain.   Vitamin D, Ergocalciferol, (DRISDOL) 1.25 MG (50000 UNIT) CAPS capsule Take 1 capsule (50,000 Units total) by mouth every 7 (seven) days. Once weekly.   [DISCONTINUED] cephALEXin (KEFLEX) 250 MG capsule Take 250 mg by mouth daily.   [DISCONTINUED] methocarbamol (ROBAXIN) 500 MG tablet Take 1 tablet (500 mg total) by mouth every 6 (six) hours as needed. (Patient taking differently: Take 500 mg by mouth every 6 (six) hours as needed for muscle spasms.)     Allergies:   Patient has no known allergies.   Social History   Socioeconomic History  Marital status: Unknown    Spouse name: Not on file   Number of children: Not on file   Years of education: Not on file   Highest education level: Not on file  Occupational History   Not on file  Tobacco Use   Smoking status: Never   Smokeless tobacco: Never  Substance and Sexual Activity   Alcohol use: No   Drug use: No   Sexual activity: Not Currently  Other Topics Concern   Not on file  Social History Narrative   Not on file   Social Determinants of Health   Financial Resource Strain: Not on file  Food Insecurity: Not on file  Transportation Needs: Not on file  Physical  Activity: Not on file  Stress: Not on file  Social Connections: Not on file     Family History: The patient's family history includes Colon polyps in her sister; Congestive Heart Failure in her mother. ROS:   Please see the history of present illness.    All 14 point review of systems negative except as described per history of present illness  EKGs/Labs/Other Studies Reviewed:      Recent Labs: No results found for requested labs within last 365 days.  Recent Lipid Panel No results found for: "CHOL", "TRIG", "HDL", "CHOLHDL", "VLDL", "LDLCALC", "LDLDIRECT"  Physical Exam:    VS:  BP 116/72 (BP Location: Left Arm, Patient Position: Sitting)   Pulse 60   Ht 5\' 5"  (1.651 m)   Wt 208 lb 9.6 oz (94.6 kg)   SpO2 95%   BMI 34.71 kg/m     Wt Readings from Last 3 Encounters:  07/28/22 208 lb 9.6 oz (94.6 kg)  01/25/22 210 lb 9.6 oz (95.5 kg)  09/15/21 210 lb 12.8 oz (95.6 kg)     GEN:  Well nourished, well developed in no acute distress HEENT: Normal NECK: No JVD; No carotid bruits LYMPHATICS: No lymphadenopathy CARDIAC: RRR, no murmurs, no rubs, no gallops RESPIRATORY:  Clear to auscultation without rales, wheezing or rhonchi  ABDOMEN: Soft, non-tender, non-distended MUSCULOSKELETAL:  No edema; No deformity  SKIN: Warm and dry LOWER EXTREMITIES: no swelling NEUROLOGIC:  Alert and oriented x 3 PSYCHIATRIC:  Normal affect   ASSESSMENT:    1. PAF (paroxysmal atrial fibrillation) (HCC)   2. Primary hypertension   3. Dyslipidemia    PLAN:    In order of problems listed above:  Paroxysmal atrial fibrillation, she is anticoagulated Eliquis 5 twice daily which I will continue, CHADS2 Vascor equals 4.  I initiated conversation about antiarrhythmic therapy she does not want to do it, therefore, I gave her Cardizem 30 mg as needed every 6 hours to take for palpitations. Essential hypertension blood pressure well-controlled continue present management. Dyslipidemia I did  review K PN which show me elevated LDL of 119 HDL 62 again she is reluctant to start any new medications.  Therefore, we will continue present management   Medication Adjustments/Labs and Tests Ordered: Current medicines are reviewed at length with the patient today.  Concerns regarding medicines are outlined above.  Orders Placed This Encounter  Procedures   EKG 12-Lead   Medication changes: No orders of the defined types were placed in this encounter.   Signed, Georgeanna Lea, MD, [facility] 07/28/2022 11:16 AM    [facility]

## 2022-08-24 DIAGNOSIS — Z1331 Encounter for screening for depression: Secondary | ICD-10-CM | POA: Diagnosis not present

## 2022-08-24 DIAGNOSIS — Z9181 History of falling: Secondary | ICD-10-CM | POA: Diagnosis not present

## 2022-08-24 DIAGNOSIS — Z0000 Encounter for general adult medical examination without abnormal findings: Secondary | ICD-10-CM | POA: Diagnosis not present

## 2022-10-13 ENCOUNTER — Telehealth: Payer: Self-pay | Admitting: Cardiology

## 2022-10-13 DIAGNOSIS — I480 Paroxysmal atrial fibrillation: Secondary | ICD-10-CM

## 2022-10-13 MED ORDER — APIXABAN 5 MG PO TABS: 5.0000 mg | ORAL_TABLET | Freq: Two times a day (BID) | ORAL | 3 refills | Status: DC

## 2022-10-13 MED ORDER — APIXABAN 5 MG PO TABS: 5.0000 mg | ORAL_TABLET | Freq: Two times a day (BID) | ORAL | 1 refills | Status: DC

## 2022-10-13 NOTE — Telephone Encounter (Addendum)
Eliquis was sent in twice today one time for a 90 day supply and the other for a 1 month supply. Called the pharmacy and canceled the Eliquis that was sent in for a 1 month supply and confirmed they had the prescription for Eliquis 90 day supply.

## 2022-10-13 NOTE — Addendum Note (Signed)
Addended by: Mellody Dance B on: 10/13/2022 01:31 PM   Modules accepted: Orders

## 2022-10-13 NOTE — Addendum Note (Signed)
Addended by: Mellody Dance B on: 10/13/2022 01:25 PM   Modules accepted: Orders

## 2022-10-13 NOTE — Telephone Encounter (Signed)
*  STAT* If patient is at the pharmacy, call can be transferred to refill team.   1. Which medications need to be refilled? (please list name of each medication and dose if known) apixaban (ELIQUIS) 5 MG TABS tablet  2. Which pharmacy/location (including street and city if local pharmacy) is medication to be sent to? [facility] [employer], [place] - [address]  3. Do they need a 30 day or 90 day supply?   90 day supply

## 2022-10-13 NOTE — Telephone Encounter (Signed)
Prescription refill request for Eliquis received. Indication: afib  Last office visit: Charyl Bigger 07/28/2022 Scr: 1.18, 06/07/2022 Age: 82 yo  Weight: 94.6 kg

## 2022-10-31 DIAGNOSIS — K219 Gastro-esophageal reflux disease without esophagitis: Secondary | ICD-10-CM | POA: Diagnosis not present

## 2022-10-31 DIAGNOSIS — N1831 Chronic kidney disease, stage 3a: Secondary | ICD-10-CM | POA: Diagnosis not present

## 2022-10-31 DIAGNOSIS — R7303 Prediabetes: Secondary | ICD-10-CM | POA: Diagnosis not present

## 2022-10-31 DIAGNOSIS — E782 Mixed hyperlipidemia: Secondary | ICD-10-CM | POA: Diagnosis not present

## 2022-10-31 DIAGNOSIS — I129 Hypertensive chronic kidney disease with stage 1 through stage 4 chronic kidney disease, or unspecified chronic kidney disease: Secondary | ICD-10-CM | POA: Diagnosis not present

## 2022-10-31 DIAGNOSIS — Z6834 Body mass index (BMI) 34.0-34.9, adult: Secondary | ICD-10-CM | POA: Diagnosis not present

## 2022-10-31 DIAGNOSIS — I4891 Unspecified atrial fibrillation: Secondary | ICD-10-CM | POA: Diagnosis not present

## 2022-11-08 DIAGNOSIS — E871 Hypo-osmolality and hyponatremia: Secondary | ICD-10-CM | POA: Diagnosis not present

## 2022-11-08 LAB — COMPREHENSIVE METABOLIC PANEL
Calcium: 9.3
EGFR: 52

## 2022-11-10 DIAGNOSIS — E876 Hypokalemia: Secondary | ICD-10-CM | POA: Diagnosis not present

## 2022-11-14 ENCOUNTER — Telehealth: Payer: Self-pay | Admitting: Cardiology

## 2022-11-14 DIAGNOSIS — E876 Hypokalemia: Secondary | ICD-10-CM | POA: Diagnosis not present

## 2022-11-14 NOTE — Telephone Encounter (Signed)
Pt c/o medication issue:  1. Name of Medication: torsemide (DEMADEX) 20 MG tablet   2. How are you currently taking this medication (dosage and times per day)?   3. Are you having a reaction (difficulty breathing--STAT)?   4. What is your medication issue? Patient called wanting to know how much fluid pill she should take.  Gus Height, NP took her off of her fluid pill last week Friday because her potassium was low and advised her to call us.  Patient has been off of torsemide since Friday.

## 2022-11-14 NOTE — Telephone Encounter (Signed)
Called PCP to get most recent labs. Will discuss Torsemide directions as soon as labs received.

## 2022-11-15 ENCOUNTER — Telehealth: Payer: Self-pay

## 2022-11-15 ENCOUNTER — Telehealth: Payer: Self-pay | Admitting: Cardiology

## 2022-11-15 DIAGNOSIS — E876 Hypokalemia: Secondary | ICD-10-CM

## 2022-11-15 NOTE — Telephone Encounter (Signed)
Spoke with pt. Advised per Dr. Madireddy's note to take Torsemide 10mg  every other day and re check BMP in 5 days. Pt verbalized understanding and had no further questions.

## 2022-11-15 NOTE — Telephone Encounter (Signed)
Pt reported that she was off Torsemide per her PCP because her potassium was low. 11-08-22 K-2.9, 11-10-22 K-2.8, 11-14-22 K-3.7. This is being followed by her PCP Gus Height, PA.

## 2022-11-15 NOTE — Addendum Note (Signed)
Addended by: Baldo Ash D on: 11/15/2022 04:54 PM   Modules accepted: Orders

## 2022-11-15 NOTE — Telephone Encounter (Signed)
Called PCP to get labs as per pt message

## 2022-11-15 NOTE — Telephone Encounter (Signed)
Patient called back to talk with Dr. [name] or nurse about her feet and legs swelling due to being off the Torsemide and want to know what to do

## 2022-11-15 NOTE — Telephone Encounter (Signed)
Please disregard

## 2022-11-21 DIAGNOSIS — E876 Hypokalemia: Secondary | ICD-10-CM | POA: Diagnosis not present

## 2022-11-22 ENCOUNTER — Telehealth: Payer: Self-pay | Admitting: Cardiology

## 2022-11-22 LAB — BASIC METABOLIC PANEL
BUN/Creatinine Ratio: 12 (ref 12–28)
BUN: 12 mg/dL (ref 8–27)
CO2: 25 mmol/L (ref 20–29)
Calcium: 9.2 mg/dL (ref 8.7–10.3)
Chloride: 102 mmol/L (ref 96–106)
Creatinine, Ser: 1.03 mg/dL — ABNORMAL HIGH (ref 0.57–1.00)
Glucose: 99 mg/dL (ref 70–99)
Potassium: 4.2 mmol/L (ref 3.5–5.2)
Sodium: 140 mmol/L (ref 134–144)
eGFR: 54 mL/min/{1.73_m2} — ABNORMAL LOW (ref >59)

## 2022-11-22 NOTE — Telephone Encounter (Signed)
Advised that the results have not been reviewed with recommendations at this time. Advised that her potassium was normal.

## 2022-11-22 NOTE — Telephone Encounter (Signed)
Patient would like a call back to discuss lab results before 1:30 PM if possible.

## 2022-11-29 ENCOUNTER — Telehealth: Payer: Self-pay

## 2022-11-29 NOTE — Telephone Encounter (Signed)
Patient notified through my chart.

## 2022-11-29 NOTE — Telephone Encounter (Signed)
-----   Message from Marlyn Corporal Madireddy sent at 11/23/2022 10:12 AM EDT ----- Reviewed lab results and sent a message on MyChart. Stable, continue with torsemide 10 mg every other day.

## 2022-12-08 ENCOUNTER — Telehealth: Payer: Self-pay

## 2022-12-08 ENCOUNTER — Telehealth: Payer: Self-pay | Admitting: Cardiology

## 2022-12-08 MED ORDER — APIXABAN 5 MG PO TABS: 5.0000 mg | ORAL_TABLET | Freq: Two times a day (BID) | ORAL | Status: DC

## 2022-12-08 NOTE — Telephone Encounter (Signed)
Patient calling the office for samples of medication:   1.  What medication and dosage are you requesting samples for? Eliquis   2.  Are you currently out of this medication?   No, patient states she has 3 boxes of samples right now, but she is in the donut hole and her medication will cost her $150+.

## 2022-12-08 NOTE — Telephone Encounter (Signed)
Number busy.  No samples available. Call [phone] and report to Eliquis that she is in the doughnut hole.

## 2022-12-08 NOTE — Telephone Encounter (Signed)
Pt called needing Eliquis samples. Samples provided

## 2022-12-13 DIAGNOSIS — Z96652 Presence of left artificial knee joint: Secondary | ICD-10-CM | POA: Diagnosis not present

## 2022-12-13 DIAGNOSIS — M1712 Unilateral primary osteoarthritis, left knee: Secondary | ICD-10-CM | POA: Diagnosis not present

## 2023-01-16 ENCOUNTER — Ambulatory Visit: Payer: PPO | Attending: Cardiology | Admitting: Cardiology

## 2023-01-16 ENCOUNTER — Encounter: Payer: Self-pay | Admitting: Cardiology

## 2023-01-16 VITALS — BP 110/70 | HR 58 | Ht 65.5 in | Wt 213.2 lb

## 2023-01-16 DIAGNOSIS — I480 Paroxysmal atrial fibrillation: Secondary | ICD-10-CM | POA: Diagnosis not present

## 2023-01-16 DIAGNOSIS — E785 Hyperlipidemia, unspecified: Secondary | ICD-10-CM | POA: Diagnosis not present

## 2023-01-16 DIAGNOSIS — I10 Essential (primary) hypertension: Secondary | ICD-10-CM | POA: Diagnosis not present

## 2023-01-16 NOTE — Patient Instructions (Signed)

## 2023-01-16 NOTE — Progress Notes (Unsigned)
Cardiology Office Note:    Date:  01/16/2023   ID:  Latasha Williams, DOB 1940-05-28, MRN [id number]  PCP:  Gus Height, PA  Cardiologist:  Gypsy Balsam, MD    Referring MD: Gus Height, [place]   Chief Complaint  Patient presents with   Follow-up    History of Present Illness:    Latasha Williams is a 82 y.o. female with past medical history significant for paroxysmal atrial fibrillation, anticoagulated, CHA2DS2-VASc equals 4, essential hypertension, dyslipidemia.  Comes today to months for follow-up.  Overall doing well described to have rare palpitations when she gets upset.  But no chest pain tightness squeezing pressure burning chest.  Past Medical History:  Diagnosis Date   Arthritis    GERD (gastroesophageal reflux disease)    History of skin cancer    BASAL CELL   Hypertension    Restless leg syndrome    OCCASIONAL PROBLEM ONLY    Past Surgical History:  Procedure Laterality Date   BREAST CYST EXCISION     bilateral  benign   CHOLECYSTECTOMY  2002   DILATION AND CURETTAGE OF UTERUS  2013   TOTAL KNEE ARTHROPLASTY Right 05/21/2012   Procedure: TOTAL KNEE ARTHROPLASTY;  Surgeon: Loanne Drilling, MD;  Location: [facility];  Service: Orthopedics;  Laterality: Right;    Current Medications: Current Meds  Medication Sig   acetaminophen (TYLENOL) 325 MG tablet Take 650 mg by mouth every 6 (six) hours as needed for mild pain or moderate pain.   apixaban (ELIQUIS) 5 MG TABS tablet Take 1 tablet (5 mg total) by mouth 2 (two) times daily.   atenolol-chlorthalidone (TENORETIC) 50-25 MG per tablet Take 0.5 tablets by mouth daily before breakfast.    ciprofloxacin (CIPRO) 250 MG tablet Take 250 mg by mouth daily with breakfast.   Multiple Vitamins-Minerals (B COMPLEX-C-E-ZINC) tablet Take 1 tablet by mouth daily. Unknown strenght   omeprazole (PRILOSEC) 20 MG capsule Take 20 mg by mouth daily.   ondansetron (ZOFRAN) 4 MG tablet Take 1 tablet (4 mg total) by mouth every 6 (six)  hours as needed for nausea.   potassium chloride SA (KLOR-CON M) 20 MEQ tablet Take 2 tablets (40 mEq total) by mouth 2 (two) times daily.   pramipexole (MIRAPEX) 0.5 MG tablet Take 1 tablet by mouth at bedtime.   torsemide (DEMADEX) 20 MG tablet Take 1 tablet (20 mg total) by mouth every other day.   traMADol (ULTRAM) 50 MG tablet Take 50-100 mg by mouth every 6 (six) hours as needed for moderate pain or severe pain.   Vitamin D, Ergocalciferol, (DRISDOL) 1.25 MG (50000 UNIT) CAPS capsule Take 1 capsule (50,000 Units total) by mouth every 7 (seven) days. Once weekly.   [DISCONTINUED] apixaban (ELIQUIS) 5 MG TABS tablet Take 1 tablet (5 mg total) by mouth 2 (two) times daily.   [DISCONTINUED] diltiazem (CARDIZEM) 30 MG tablet Take 1 tablet (30 mg total) by mouth every 8 (eight) hours as needed. As needed for palpitations (Patient taking differently: Take 30 mg by mouth every 8 (eight) hours as needed (Palpitations). As needed for palpitations)   [DISCONTINUED] Folic Acid-Cholecalciferol 2-130 MG-UNIT TABS Take 1 mg by mouth daily.     Allergies:   Patient has no known allergies.   Social History   Socioeconomic History   Marital status: Unknown    Spouse name: Not on file   Number of children: Not on file   Years of education: Not on file   Highest education level:  Not on file  Occupational History   Not on file  Tobacco Use   Smoking status: Never   Smokeless tobacco: Never  Substance and Sexual Activity   Alcohol use: No   Drug use: No   Sexual activity: Not Currently  Other Topics Concern   Not on file  Social History Narrative   Not on file   Social Determinants of Health   Financial Resource Strain: Not on file  Food Insecurity: Not on file  Transportation Needs: Not on file  Physical Activity: Not on file  Stress: Not on file  Social Connections: Not on file     Family History: The patient's family history includes Colon polyps in her sister; Congestive Heart  Failure in her mother. ROS:   Please see the history of present illness.    All 14 point review of systems negative except as described per history of present illness  EKGs/Labs/Other Studies Reviewed:         Recent Labs: 11/21/2022: BUN 12; Creatinine, Ser 1.03; Potassium 4.2; Sodium 140  Recent Lipid Panel No results found for: "CHOL", "TRIG", "HDL", "CHOLHDL", "VLDL", "LDLCALC", "LDLDIRECT"  Physical Exam:    VS:  BP 110/70 (BP Location: Left Arm, Patient Position: Sitting)   Pulse (!) 58   Ht 5' 5.5" (1.664 m)   Wt 213 lb 3.2 oz (96.7 kg)   SpO2 100%   BMI 34.94 kg/m     Wt Readings from Last 3 Encounters:  01/16/23 213 lb 3.2 oz (96.7 kg)  07/28/22 208 lb 9.6 oz (94.6 kg)  01/25/22 210 lb 9.6 oz (95.5 kg)     GEN:  Well nourished, well developed in no acute distress HEENT: Normal NECK: No JVD; No carotid bruits LYMPHATICS: No lymphadenopathy CARDIAC: RRR, no murmurs, no rubs, no gallops RESPIRATORY:  Clear to auscultation without rales, wheezing or rhonchi  ABDOMEN: Soft, non-tender, non-distended MUSCULOSKELETAL:  No edema; No deformity  SKIN: Warm and dry LOWER EXTREMITIES: no swelling NEUROLOGIC:  Alert and oriented x 3 PSYCHIATRIC:  Normal affect   ASSESSMENT:    1. Primary hypertension   2. PAF (paroxysmal atrial fibrillation) (HCC)   3. Dyslipidemia    PLAN:    In order of problems listed above:  Essential hypertension blood pressure well-controlled continue present management. Paroxysmal atrial fibrillation: Stable denies have any palpitations, anticoagulated, does not want to take any antiarrhythmic. Dyslipidemia again conversation about potentially taking some medication for which she does not want to do it.  Will continue present management   Medication Adjustments/Labs and Tests Ordered: Current medicines are reviewed at length with the patient today.  Concerns regarding medicines are outlined above.  Orders Placed This Encounter   Procedures   Basic metabolic panel   Medication changes: No orders of the defined types were placed in this encounter.   Signed, Georgeanna Lea, MD, [facility] 01/16/2023 10:29 AM    [facility]

## 2023-01-17 LAB — BASIC METABOLIC PANEL
BUN/Creatinine Ratio: 13 (ref 12–28)
BUN: 13 mg/dL (ref 8–27)
CO2: 26 mmol/L (ref 20–29)
Calcium: 9.3 mg/dL (ref 8.7–10.3)
Chloride: 100 mmol/L (ref 96–106)
Creatinine, Ser: 0.98 mg/dL (ref 0.57–1.00)
Glucose: 112 mg/dL — ABNORMAL HIGH (ref 70–99)
Potassium: 4.2 mmol/L (ref 3.5–5.2)
Sodium: 139 mmol/L (ref 134–144)
eGFR: 57 mL/min/{1.73_m2} — ABNORMAL LOW (ref >59)

## 2023-01-25 ENCOUNTER — Telehealth: Payer: Self-pay

## 2023-01-25 NOTE — Telephone Encounter (Signed)
Left message on My Chart with normal lab results per Dr. Krasowski's note. Routed to PCP.  

## 2023-03-15 DIAGNOSIS — Z6835 Body mass index (BMI) 35.0-35.9, adult: Secondary | ICD-10-CM | POA: Diagnosis not present

## 2023-03-15 DIAGNOSIS — K5909 Other constipation: Secondary | ICD-10-CM | POA: Diagnosis not present

## 2023-03-16 DIAGNOSIS — I10 Essential (primary) hypertension: Secondary | ICD-10-CM | POA: Diagnosis not present

## 2023-03-16 DIAGNOSIS — E876 Hypokalemia: Secondary | ICD-10-CM | POA: Diagnosis not present

## 2023-03-16 DIAGNOSIS — K219 Gastro-esophageal reflux disease without esophagitis: Secondary | ICD-10-CM | POA: Diagnosis not present

## 2023-03-16 DIAGNOSIS — Z6835 Body mass index (BMI) 35.0-35.9, adult: Secondary | ICD-10-CM | POA: Diagnosis not present

## 2023-03-16 DIAGNOSIS — N390 Urinary tract infection, site not specified: Secondary | ICD-10-CM | POA: Diagnosis not present

## 2023-03-16 DIAGNOSIS — K5909 Other constipation: Secondary | ICD-10-CM | POA: Diagnosis not present

## 2023-03-16 DIAGNOSIS — R600 Localized edema: Secondary | ICD-10-CM | POA: Diagnosis not present

## 2023-03-16 DIAGNOSIS — R5383 Other fatigue: Secondary | ICD-10-CM | POA: Diagnosis not present

## 2023-03-20 DIAGNOSIS — Z6835 Body mass index (BMI) 35.0-35.9, adult: Secondary | ICD-10-CM | POA: Diagnosis not present

## 2023-03-20 DIAGNOSIS — R600 Localized edema: Secondary | ICD-10-CM | POA: Diagnosis not present

## 2023-03-20 DIAGNOSIS — K219 Gastro-esophageal reflux disease without esophagitis: Secondary | ICD-10-CM | POA: Diagnosis not present

## 2023-03-20 DIAGNOSIS — J069 Acute upper respiratory infection, unspecified: Secondary | ICD-10-CM | POA: Diagnosis not present

## 2023-03-20 DIAGNOSIS — I10 Essential (primary) hypertension: Secondary | ICD-10-CM | POA: Diagnosis not present

## 2023-03-20 DIAGNOSIS — N390 Urinary tract infection, site not specified: Secondary | ICD-10-CM | POA: Diagnosis not present

## 2023-03-20 DIAGNOSIS — E876 Hypokalemia: Secondary | ICD-10-CM | POA: Diagnosis not present

## 2023-03-20 DIAGNOSIS — K5909 Other constipation: Secondary | ICD-10-CM | POA: Diagnosis not present

## 2023-03-30 DIAGNOSIS — K5909 Other constipation: Secondary | ICD-10-CM | POA: Diagnosis not present

## 2023-03-30 DIAGNOSIS — E876 Hypokalemia: Secondary | ICD-10-CM | POA: Diagnosis not present

## 2023-03-30 DIAGNOSIS — N1831 Chronic kidney disease, stage 3a: Secondary | ICD-10-CM | POA: Diagnosis not present

## 2023-03-30 DIAGNOSIS — R600 Localized edema: Secondary | ICD-10-CM | POA: Diagnosis not present

## 2023-03-30 DIAGNOSIS — J069 Acute upper respiratory infection, unspecified: Secondary | ICD-10-CM | POA: Diagnosis not present

## 2023-03-30 DIAGNOSIS — D692 Other nonthrombocytopenic purpura: Secondary | ICD-10-CM | POA: Diagnosis not present

## 2023-03-30 DIAGNOSIS — K219 Gastro-esophageal reflux disease without esophagitis: Secondary | ICD-10-CM | POA: Diagnosis not present

## 2023-03-30 DIAGNOSIS — I10 Essential (primary) hypertension: Secondary | ICD-10-CM | POA: Diagnosis not present

## 2023-03-30 DIAGNOSIS — N390 Urinary tract infection, site not specified: Secondary | ICD-10-CM | POA: Diagnosis not present

## 2023-03-30 DIAGNOSIS — R110 Nausea: Secondary | ICD-10-CM | POA: Diagnosis not present

## 2023-03-30 DIAGNOSIS — Z6834 Body mass index (BMI) 34.0-34.9, adult: Secondary | ICD-10-CM | POA: Diagnosis not present

## 2023-04-11 ENCOUNTER — Telehealth: Payer: Self-pay | Admitting: Cardiology

## 2023-04-11 NOTE — Telephone Encounter (Signed)
 Pt c/o A-fib and increased BP. She wants an appt. She will see her PCP on Thursday and address her issues with PCP. Will send information to front desk to make appt after that. Pt verbalized understanding and had no further questions.

## 2023-04-11 NOTE — Telephone Encounter (Signed)
 Patient c/o Palpitations:  High priority if patient c/o lightheadedness, shortness of breath, or chest pain  How long have you had palpitations/irregular HR/ Afib? Are you having the symptoms now?  Rapid HR/palpitations ongoing + low energy  Are you currently experiencing lightheadedness, SOB or CP?  No   Do you have a history of afib (atrial fibrillation) or irregular heart rhythm?  Pt has afib  Have you checked your BP or HR? (document readings if available):  2/25: 184/74  Are you experiencing any other symptoms?  Low energy

## 2023-04-13 DIAGNOSIS — D692 Other nonthrombocytopenic purpura: Secondary | ICD-10-CM | POA: Diagnosis not present

## 2023-04-13 DIAGNOSIS — K5909 Other constipation: Secondary | ICD-10-CM | POA: Diagnosis not present

## 2023-04-13 DIAGNOSIS — Z6835 Body mass index (BMI) 35.0-35.9, adult: Secondary | ICD-10-CM | POA: Diagnosis not present

## 2023-04-13 DIAGNOSIS — R600 Localized edema: Secondary | ICD-10-CM | POA: Diagnosis not present

## 2023-04-13 DIAGNOSIS — K219 Gastro-esophageal reflux disease without esophagitis: Secondary | ICD-10-CM | POA: Diagnosis not present

## 2023-04-13 DIAGNOSIS — I10 Essential (primary) hypertension: Secondary | ICD-10-CM | POA: Diagnosis not present

## 2023-04-13 DIAGNOSIS — I4891 Unspecified atrial fibrillation: Secondary | ICD-10-CM | POA: Diagnosis not present

## 2023-04-13 DIAGNOSIS — E876 Hypokalemia: Secondary | ICD-10-CM | POA: Diagnosis not present

## 2023-04-13 DIAGNOSIS — N390 Urinary tract infection, site not specified: Secondary | ICD-10-CM | POA: Diagnosis not present

## 2023-04-13 DIAGNOSIS — N1831 Chronic kidney disease, stage 3a: Secondary | ICD-10-CM | POA: Diagnosis not present

## 2023-04-14 ENCOUNTER — Other Ambulatory Visit: Payer: Self-pay

## 2023-04-14 ENCOUNTER — Telehealth: Payer: Self-pay | Admitting: Cardiology

## 2023-04-14 NOTE — Telephone Encounter (Signed)
 Patient c/o Palpitations:  High priority if patient c/o lightheadedness, shortness of breath, or chest pain  How long have you had palpitations/irregular HR/ Afib? Are you having the symptoms now? No, was experiencing afib 02/24, 02/25 & 02/26  Are you currently experiencing lightheadedness, SOB or CP? No   Do you have a history of afib (atrial fibrillation) or irregular heart rhythm? Yes   Have you checked your BP or HR? (document readings if available): N/A   Are you experiencing any other symptoms? No, patient states she feels fine now, but was experiencing afib the last 3 days.

## 2023-04-14 NOTE — Telephone Encounter (Signed)
 Called patient and she reported that she had an episode A-fib that lasted from 2/24 through 2/26. She stated that she was having palpations and her heart felt like it was beating out of her chest. She believes that she had an anxiety attack. Currently she reports that her heart is fine and she is not having any palpations at this time. Patient does have a history of A-fib and is taking Eliquis. Please advise.

## 2023-04-14 NOTE — Telephone Encounter (Signed)
 Called patient and informed her of Latasha Williams recommendation below:  "She is anticoagulated, so this is the biggest concern with atrial fibrillation.  #1 is to simply continue with doing another option will be try antiarrhythmic medication option #3 is talked to our EP team for consideration of ablation.  Above is the recommendation from Dr. Kirtland Bouchard after her most recent monitor.  She could try an antiarrhythmic, or we could send her to EP to discuss ablation."  Patient verbalized understanding and an appointment was scheduled for her to see  Dr. [name] on 04/28/23.

## 2023-04-27 ENCOUNTER — Encounter: Payer: Self-pay | Admitting: Cardiology

## 2023-04-28 ENCOUNTER — Encounter: Payer: Self-pay | Admitting: Cardiology

## 2023-04-28 ENCOUNTER — Ambulatory Visit: Payer: PPO | Attending: Cardiology | Admitting: Cardiology

## 2023-04-28 VITALS — BP 120/70 | HR 87 | Ht 65.0 in | Wt 214.4 lb

## 2023-04-28 DIAGNOSIS — I480 Paroxysmal atrial fibrillation: Secondary | ICD-10-CM

## 2023-04-28 DIAGNOSIS — E785 Hyperlipidemia, unspecified: Secondary | ICD-10-CM | POA: Diagnosis not present

## 2023-04-28 DIAGNOSIS — I10 Essential (primary) hypertension: Secondary | ICD-10-CM | POA: Diagnosis not present

## 2023-04-28 MED ORDER — APIXABAN 5 MG PO TABS: 5.0000 mg | ORAL_TABLET | Freq: Two times a day (BID) | ORAL | Status: DC

## 2023-04-28 NOTE — Patient Instructions (Signed)
Medication Instructions:  Your physician recommends that you continue on your current medications as directed. Please refer to the Current Medication list given to you today.  *If you need a refill on your cardiac medications before your next appointment, please call your pharmacy*   Lab Work: None Ordered If you have labs (blood work) drawn today and your tests are completely normal, you will receive your results only by: [place] (if you have MyChart) OR A paper copy in the mail If you have any lab test that is abnormal or we need to change your treatment, we will call you to review the results.   Testing/Procedures: None Ordered   Follow-Up: At [facility], you and your health needs are our priority.  As part of our continuing mission to provide you with exceptional heart care, we have created designated Provider Care Teams.  These Care Teams include your primary Cardiologist (physician) and Advanced Practice Providers (APPs -  Physician Assistants and Nurse Practitioners) who all work together to provide you with the care you need, when you need it.  We recommend signing up for the patient portal called "MyChart".  Sign up information is provided on this After Visit Summary.  MyChart is used to connect with patients for Virtual Visits (Telemedicine).  Patients are able to view lab/test results, encounter notes, upcoming appointments, etc.  Non-urgent messages can be sent to your provider as well.   To learn more about what you can do with MyChart, go to [email or web address].    Your next appointment:   6 month  The format for your next appointment:   In Person  Provider:   Jenne Campus, MD    Other Instructions NA

## 2023-04-28 NOTE — Progress Notes (Signed)
 Cardiology Office Note:    Date:  04/28/2023   ID:  Marciano Sequin, DOB September 01, 1940, MRN [id number]  PCP:  Gus Height, PA  Cardiologist:  Gypsy Balsam, MD    Referring MD: Gus Height, [place]   Chief Complaint  Patient presents with   Atrial Fibrillation    History of Present Illness:    Latasha Williams is a 82 y.o. female past medical history significant for paroxysmal atrial fibrillation, anticoagulated, CHA2DS2-VASc equals 4, essential hypertension, dyslipidemia, refused to take any medication for cholesterol. Comes today to months for follow-up.  Overall doing well.  Denies have any chest pain tightness squeezing pressure burning chest.  She can walk climb stairs with no difficulties.  Described to have rare episode of palpitations happen typically when she gets nervous  Past Medical History:  Diagnosis Date   Arthritis    GERD (gastroesophageal reflux disease)    History of skin cancer    BASAL CELL   Hypertension    Restless leg syndrome    OCCASIONAL PROBLEM ONLY    Past Surgical History:  Procedure Laterality Date   BREAST CYST EXCISION     bilateral  benign   CHOLECYSTECTOMY  2002   DILATION AND CURETTAGE OF UTERUS  2013   TOTAL KNEE ARTHROPLASTY Right 05/21/2012   Procedure: TOTAL KNEE ARTHROPLASTY;  Surgeon: Loanne Drilling, MD;  Location: [facility];  Service: Orthopedics;  Laterality: Right;    Current Medications: Current Meds  Medication Sig   acetaminophen (TYLENOL) 325 MG tablet Take 650 mg by mouth every 6 (six) hours as needed for mild pain or moderate pain.   apixaban (ELIQUIS) 5 MG TABS tablet Take 1 tablet (5 mg total) by mouth 2 (two) times daily.   apixaban (ELIQUIS) 5 MG TABS tablet Take 1 tablet (5 mg total) by mouth 2 (two) times daily.   atenolol-chlorthalidone (TENORETIC) 50-25 MG per tablet Take 0.5 tablets by mouth daily before breakfast.    ciprofloxacin (CIPRO) 250 MG tablet Take 250 mg by mouth daily with breakfast.   Multiple  Vitamins-Minerals (B COMPLEX-C-E-ZINC) tablet Take 1 tablet by mouth daily. Unknown strenght   omeprazole (PRILOSEC) 20 MG capsule Take 20 mg by mouth daily.   ondansetron (ZOFRAN) 4 MG tablet Take 1 tablet (4 mg total) by mouth every 6 (six) hours as needed for nausea.   pramipexole (MIRAPEX) 0.5 MG tablet Take 1 tablet by mouth at bedtime.   traMADol (ULTRAM) 50 MG tablet Take 50-100 mg by mouth every 6 (six) hours as needed for moderate pain or severe pain.   Vitamin D, Ergocalciferol, (DRISDOL) 1.25 MG (50000 UNIT) CAPS capsule Take 1 capsule (50,000 Units total) by mouth every 7 (seven) days. Once weekly.     Allergies:   Patient has no known allergies.   Social History   Socioeconomic History   Marital status: Unknown    Spouse name: Not on file   Number of children: Not on file   Years of education: Not on file   Highest education level: Not on file  Occupational History   Not on file  Tobacco Use   Smoking status: Never   Smokeless tobacco: Never  Substance and Sexual Activity   Alcohol use: No   Drug use: No   Sexual activity: Not Currently  Other Topics Concern   Not on file  Social History Narrative   Not on file   Social Drivers of Health   Financial Resource Strain: Not on file  Food  Insecurity: Not on file  Transportation Needs: Not on file  Physical Activity: Not on file  Stress: Not on file  Social Connections: Not on file     Family History: The patient's family history includes Colon polyps in her sister; Congestive Heart Failure in her mother. ROS:   Please see the history of present illness.    All 14 point review of systems negative except as described per history of present illness  EKGs/Labs/Other Studies Reviewed:    EKG Interpretation Date/Time:  Friday April 28 2023 10:44:52 EDT Ventricular Rate:  51 PR Interval:  170 QRS Duration:  96 QT Interval:  460 QTC Calculation: 423 R Axis:   -17  Text Interpretation: Sinus bradycardia with  Premature supraventricular complexes Cannot rule out Anterior infarct , age undetermined Abnormal ECG No previous ECGs available Confirmed by Gypsy Balsam [phone]) on 04/28/2023 11:08:22 AM    Recent Labs: 01/16/2023: BUN 13; Creatinine, Ser 0.98; Potassium 4.2; Sodium 139  Recent Lipid Panel No results found for: "CHOL", "TRIG", "HDL", "CHOLHDL", "VLDL", "LDLCALC", "LDLDIRECT"  Physical Exam:    VS:  BP 120/70 (BP Location: Left Arm, Patient Position: Sitting)   Pulse 87   Ht 5\' 5"  (1.651 m)   Wt 214 lb 6.4 oz (97.3 kg)   SpO2 98%   BMI 35.68 kg/m     Wt Readings from Last 3 Encounters:  04/28/23 214 lb 6.4 oz (97.3 kg)  01/16/23 213 lb 3.2 oz (96.7 kg)  07/28/22 208 lb 9.6 oz (94.6 kg)     GEN:  Well nourished, well developed in no acute distress HEENT: Normal NECK: No JVD; No carotid bruits LYMPHATICS: No lymphadenopathy CARDIAC: RRR, no murmurs, no rubs, no gallops RESPIRATORY:  Clear to auscultation without rales, wheezing or rhonchi  ABDOMEN: Soft, non-tender, non-distended MUSCULOSKELETAL:  No edema; No deformity  SKIN: Warm and dry LOWER EXTREMITIES: no swelling NEUROLOGIC:  Alert and oriented x 3 PSYCHIATRIC:  Normal affect   ASSESSMENT:    1. Primary hypertension   2. PAF (paroxysmal atrial fibrillation) (HCC)   3. Dyslipidemia    PLAN:    In order of problems listed above:  Paroxysmal atrial fibrillation does not want to take any antiarrhythmic likely overall very rare episodes.  Continue anticoagulation. Essential hypertension blood pressure well-controlled continue present management. Dyslipidemia I did review K PN which show me data from October 31, 2022 with LDL 115 HDL 47.  She refused to take any medication for Sinus bradycardia with some APCs.  Tolerated well.  Continue present management.  She includes atenolol   Medication Adjustments/Labs and Tests Ordered: Current medicines are reviewed at length with the patient today.  Concerns  regarding medicines are outlined above.  Orders Placed This Encounter  Procedures   EKG 12-Lead   Medication changes:  Meds ordered this encounter  Medications   apixaban (ELIQUIS) 5 MG TABS tablet    Sig: Take 1 tablet (5 mg total) by mouth 2 (two) times daily.    Lot Number?:   [id number]    Expiration Date?:   05/14/2024    Quantity:   42    Signed, Georgeanna Lea, MD, [facility] 04/28/2023 11:39 AM    [facility]

## 2023-05-15 DIAGNOSIS — N390 Urinary tract infection, site not specified: Secondary | ICD-10-CM | POA: Diagnosis not present

## 2023-05-15 DIAGNOSIS — D692 Other nonthrombocytopenic purpura: Secondary | ICD-10-CM | POA: Diagnosis not present

## 2023-05-15 DIAGNOSIS — Z6834 Body mass index (BMI) 34.0-34.9, adult: Secondary | ICD-10-CM | POA: Diagnosis not present

## 2023-05-15 DIAGNOSIS — K219 Gastro-esophageal reflux disease without esophagitis: Secondary | ICD-10-CM | POA: Diagnosis not present

## 2023-05-15 DIAGNOSIS — K5909 Other constipation: Secondary | ICD-10-CM | POA: Diagnosis not present

## 2023-05-15 DIAGNOSIS — R600 Localized edema: Secondary | ICD-10-CM | POA: Diagnosis not present

## 2023-05-15 DIAGNOSIS — I4891 Unspecified atrial fibrillation: Secondary | ICD-10-CM | POA: Diagnosis not present

## 2023-05-15 DIAGNOSIS — I10 Essential (primary) hypertension: Secondary | ICD-10-CM | POA: Diagnosis not present

## 2023-05-15 DIAGNOSIS — E876 Hypokalemia: Secondary | ICD-10-CM | POA: Diagnosis not present

## 2023-05-15 DIAGNOSIS — N1831 Chronic kidney disease, stage 3a: Secondary | ICD-10-CM | POA: Diagnosis not present

## 2023-06-05 DIAGNOSIS — I4891 Unspecified atrial fibrillation: Secondary | ICD-10-CM | POA: Diagnosis not present

## 2023-06-05 DIAGNOSIS — E876 Hypokalemia: Secondary | ICD-10-CM | POA: Diagnosis not present

## 2023-06-05 DIAGNOSIS — R600 Localized edema: Secondary | ICD-10-CM | POA: Diagnosis not present

## 2023-06-05 DIAGNOSIS — N1831 Chronic kidney disease, stage 3a: Secondary | ICD-10-CM | POA: Diagnosis not present

## 2023-06-05 DIAGNOSIS — I10 Essential (primary) hypertension: Secondary | ICD-10-CM | POA: Diagnosis not present

## 2023-06-05 DIAGNOSIS — D692 Other nonthrombocytopenic purpura: Secondary | ICD-10-CM | POA: Diagnosis not present

## 2023-06-05 DIAGNOSIS — K5909 Other constipation: Secondary | ICD-10-CM | POA: Diagnosis not present

## 2023-06-05 DIAGNOSIS — N390 Urinary tract infection, site not specified: Secondary | ICD-10-CM | POA: Diagnosis not present

## 2023-06-05 DIAGNOSIS — K219 Gastro-esophageal reflux disease without esophagitis: Secondary | ICD-10-CM | POA: Diagnosis not present

## 2023-06-12 DIAGNOSIS — K219 Gastro-esophageal reflux disease without esophagitis: Secondary | ICD-10-CM | POA: Diagnosis not present

## 2023-06-12 DIAGNOSIS — D692 Other nonthrombocytopenic purpura: Secondary | ICD-10-CM | POA: Diagnosis not present

## 2023-06-12 DIAGNOSIS — I4891 Unspecified atrial fibrillation: Secondary | ICD-10-CM | POA: Diagnosis not present

## 2023-06-12 DIAGNOSIS — E876 Hypokalemia: Secondary | ICD-10-CM | POA: Diagnosis not present

## 2023-06-12 DIAGNOSIS — Z6835 Body mass index (BMI) 35.0-35.9, adult: Secondary | ICD-10-CM | POA: Diagnosis not present

## 2023-06-12 DIAGNOSIS — I10 Essential (primary) hypertension: Secondary | ICD-10-CM | POA: Diagnosis not present

## 2023-06-12 DIAGNOSIS — R600 Localized edema: Secondary | ICD-10-CM | POA: Diagnosis not present

## 2023-06-12 DIAGNOSIS — N1831 Chronic kidney disease, stage 3a: Secondary | ICD-10-CM | POA: Diagnosis not present

## 2023-06-12 DIAGNOSIS — K5909 Other constipation: Secondary | ICD-10-CM | POA: Diagnosis not present

## 2023-06-12 DIAGNOSIS — N390 Urinary tract infection, site not specified: Secondary | ICD-10-CM | POA: Diagnosis not present

## 2023-07-03 DIAGNOSIS — I10 Essential (primary) hypertension: Secondary | ICD-10-CM | POA: Diagnosis not present

## 2023-07-03 DIAGNOSIS — K219 Gastro-esophageal reflux disease without esophagitis: Secondary | ICD-10-CM | POA: Diagnosis not present

## 2023-07-03 DIAGNOSIS — D692 Other nonthrombocytopenic purpura: Secondary | ICD-10-CM | POA: Diagnosis not present

## 2023-07-03 DIAGNOSIS — N390 Urinary tract infection, site not specified: Secondary | ICD-10-CM | POA: Diagnosis not present

## 2023-07-03 DIAGNOSIS — Z6834 Body mass index (BMI) 34.0-34.9, adult: Secondary | ICD-10-CM | POA: Diagnosis not present

## 2023-07-03 DIAGNOSIS — R300 Dysuria: Secondary | ICD-10-CM | POA: Diagnosis not present

## 2023-07-03 DIAGNOSIS — N1831 Chronic kidney disease, stage 3a: Secondary | ICD-10-CM | POA: Diagnosis not present

## 2023-07-03 DIAGNOSIS — K5909 Other constipation: Secondary | ICD-10-CM | POA: Diagnosis not present

## 2023-07-03 DIAGNOSIS — R600 Localized edema: Secondary | ICD-10-CM | POA: Diagnosis not present

## 2023-07-03 DIAGNOSIS — I4891 Unspecified atrial fibrillation: Secondary | ICD-10-CM | POA: Diagnosis not present

## 2023-07-03 DIAGNOSIS — E876 Hypokalemia: Secondary | ICD-10-CM | POA: Diagnosis not present

## 2023-07-18 ENCOUNTER — Encounter: Payer: Self-pay | Admitting: Cardiology

## 2023-07-18 ENCOUNTER — Ambulatory Visit: Attending: Cardiology | Admitting: Cardiology

## 2023-07-18 VITALS — BP 108/68 | HR 61 | Ht 65.5 in | Wt 210.2 lb

## 2023-07-18 DIAGNOSIS — E785 Hyperlipidemia, unspecified: Secondary | ICD-10-CM | POA: Diagnosis not present

## 2023-07-18 DIAGNOSIS — I4719 Other supraventricular tachycardia: Secondary | ICD-10-CM

## 2023-07-18 DIAGNOSIS — I10 Essential (primary) hypertension: Secondary | ICD-10-CM | POA: Diagnosis not present

## 2023-07-18 MED ORDER — APIXABAN 5 MG PO TABS: 5.0000 mg | ORAL_TABLET | Freq: Two times a day (BID) | ORAL | Status: AC

## 2023-07-18 NOTE — Patient Instructions (Signed)

## 2023-07-18 NOTE — Progress Notes (Signed)
 Cardiology Office Note:    Date:  07/18/2023   ID:  Latasha Williams, DOB August 03, 1940, MRN [id number]  PCP:  Angelique Barer, MD  Cardiologist:  Ralene Burger, MD    Referring MD: Monalisa Angles, [place]   Chief Complaint  Patient presents with   Follow-up  Doing well  History of Present Illness:    Latasha Williams is a 83 y.o. female past medical history significant for paroxysmal atrial fibrillation, essential hypertension, dyslipidemia refused to take any statin.  Comes today 2 months for follow-up she is very cheerful and happy today.  Denies have any chest pain tightness squeezing pressure burning chest but describes 1 episode of palpitation lasting for about 3 hours she thinks she probably Panic attack.  Past Medical History:  Diagnosis Date   Arthritis    GERD (gastroesophageal reflux disease)    History of skin cancer    BASAL CELL   Hypertension    Restless leg syndrome    OCCASIONAL PROBLEM ONLY    Past Surgical History:  Procedure Laterality Date   BREAST CYST EXCISION     bilateral  benign   CHOLECYSTECTOMY  2002   DILATION AND CURETTAGE OF UTERUS  2013   TOTAL KNEE ARTHROPLASTY Right 05/21/2012   Procedure: TOTAL KNEE ARTHROPLASTY;  Surgeon: Aurther Blue, MD;  Location: [facility];  Service: Orthopedics;  Laterality: Right;    Current Medications: Current Meds  Medication Sig   acetaminophen  (TYLENOL ) 325 MG tablet Take 650 mg by mouth every 6 (six) hours as needed for mild pain or moderate pain.   apixaban  (ELIQUIS ) 5 MG TABS tablet Take 1 tablet (5 mg total) by mouth 2 (two) times daily.   apixaban  (ELIQUIS ) 5 MG TABS tablet Take 1 tablet (5 mg total) by mouth 2 (two) times daily.   apixaban  (ELIQUIS ) 5 MG TABS tablet Take 1 tablet (5 mg total) by mouth 2 (two) times daily.   atenolol -chlorthalidone  (TENORETIC ) 50-25 MG per tablet Take 0.5 tablets by mouth daily before breakfast.    ciprofloxacin (CIPRO) 250 MG tablet Take 250 mg by mouth daily with breakfast.    Multiple Vitamins-Minerals (B COMPLEX-C-E-ZINC) tablet Take 1 tablet by mouth daily. Unknown strenght   omeprazole  (PRILOSEC) 20 MG capsule Take 20 mg by mouth daily.   ondansetron  (ZOFRAN ) 4 MG tablet Take 1 tablet (4 mg total) by mouth every 6 (six) hours as needed for nausea.   pramipexole (MIRAPEX) 0.5 MG tablet Take 1 tablet by mouth at bedtime.   traMADol  (ULTRAM ) 50 MG tablet Take 50-100 mg by mouth every 6 (six) hours as needed for moderate pain or severe pain.   Vitamin D , Ergocalciferol , (DRISDOL ) 1.25 MG (50000 UNIT) CAPS capsule Take 1 capsule (50,000 Units total) by mouth every 7 (seven) days. Once weekly.     Allergies:   Patient has no known allergies.   Social History   Socioeconomic History   Marital status: Unknown    Spouse name: Not on file   Number of children: Not on file   Years of education: Not on file   Highest education level: Not on file  Occupational History   Not on file  Tobacco Use   Smoking status: Never   Smokeless tobacco: Never  Substance and Sexual Activity   Alcohol use: No   Drug use: No   Sexual activity: Not Currently  Other Topics Concern   Not on file  Social History Narrative   Not on file   Social Drivers  of Health   Financial Resource Strain: Not on file  Food Insecurity: Not on file  Transportation Needs: Not on file  Physical Activity: Not on file  Stress: Not on file  Social Connections: Not on file     Family History: The patient's family history includes Colon polyps in her sister; Congestive Heart Failure in her mother. ROS:   Please see the history of present illness.    All 14 point review of systems negative except as described per history of present illness  EKGs/Labs/Other Studies Reviewed:         Recent Labs: 01/16/2023: BUN 13; Creatinine, Ser 0.98; Potassium 4.2; Sodium 139  Recent Lipid Panel No results found for: "CHOL", "TRIG", "HDL", "CHOLHDL", "VLDL", "LDLCALC", "LDLDIRECT"  Physical Exam:     VS:  BP 108/68 (BP Location: Right Arm, Patient Position: Sitting)   Pulse 61   Ht 5' 5.5" (1.664 m)   Wt 210 lb 3.2 oz (95.3 kg)   SpO2 98%   BMI 34.45 kg/m     Wt Readings from Last 3 Encounters:  07/18/23 210 lb 3.2 oz (95.3 kg)  04/28/23 214 lb 6.4 oz (97.3 kg)  01/16/23 213 lb 3.2 oz (96.7 kg)     GEN:  Well nourished, well developed in no acute distress HEENT: Normal NECK: No JVD; No carotid bruits LYMPHATICS: No lymphadenopathy CARDIAC: RRR, no murmurs, no rubs, no gallops RESPIRATORY:  Clear to auscultation without rales, wheezing or rhonchi  ABDOMEN: Soft, non-tender, non-distended MUSCULOSKELETAL:  No edema; No deformity  SKIN: Warm and dry LOWER EXTREMITIES: no swelling NEUROLOGIC:  Alert and oriented x 3 PSYCHIATRIC:  Normal affect   ASSESSMENT:    1. PAT (paroxysmal atrial tachycardia) (HCC)   2. Primary hypertension   3. Dyslipidemia    PLAN:    In order of problems listed above:  Paroxysmal atrial fibrillation.  Described to have one-time episode of palpitations lasting for about 3 hours but this is first time within years I asked her simply to let me know if this happened again.  If that became more frequent of this he needs to be more aggressive with management of this problem. Essential hypertension blood pressure well-controlled continue present management. Dyslipidemia still refused to take any cholesterol-lowering medication.   Medication Adjustments/Labs and Tests Ordered: Current medicines are reviewed at length with the patient today.  Concerns regarding medicines are outlined above.  No orders of the defined types were placed in this encounter.  Medication changes:  Meds ordered this encounter  Medications   apixaban  (ELIQUIS ) 5 MG TABS tablet    Sig: Take 1 tablet (5 mg total) by mouth 2 (two) times daily.    Lot Number?:   [id number]    Expiration Date?:   05/14/2024    Quantity:   60    Signed, Manfred Seed, MD,  [facility] 07/18/2023 4:46 PM    [facility]

## 2023-08-11 ENCOUNTER — Other Ambulatory Visit: Payer: Self-pay | Admitting: Cardiology

## 2023-09-08 DIAGNOSIS — D225 Melanocytic nevi of trunk: Secondary | ICD-10-CM | POA: Diagnosis not present

## 2023-09-08 DIAGNOSIS — L820 Inflamed seborrheic keratosis: Secondary | ICD-10-CM | POA: Diagnosis not present

## 2023-10-09 DIAGNOSIS — K219 Gastro-esophageal reflux disease without esophagitis: Secondary | ICD-10-CM | POA: Diagnosis not present

## 2023-10-09 DIAGNOSIS — R600 Localized edema: Secondary | ICD-10-CM | POA: Diagnosis not present

## 2023-10-09 DIAGNOSIS — I4891 Unspecified atrial fibrillation: Secondary | ICD-10-CM | POA: Diagnosis not present

## 2023-10-09 DIAGNOSIS — N1831 Chronic kidney disease, stage 3a: Secondary | ICD-10-CM | POA: Diagnosis not present

## 2023-10-09 DIAGNOSIS — I10 Essential (primary) hypertension: Secondary | ICD-10-CM | POA: Diagnosis not present

## 2023-10-09 DIAGNOSIS — N390 Urinary tract infection, site not specified: Secondary | ICD-10-CM | POA: Diagnosis not present

## 2023-10-09 DIAGNOSIS — E876 Hypokalemia: Secondary | ICD-10-CM | POA: Diagnosis not present

## 2023-10-09 DIAGNOSIS — D692 Other nonthrombocytopenic purpura: Secondary | ICD-10-CM | POA: Diagnosis not present

## 2023-10-09 DIAGNOSIS — Z6834 Body mass index (BMI) 34.0-34.9, adult: Secondary | ICD-10-CM | POA: Diagnosis not present

## 2023-10-09 DIAGNOSIS — K5909 Other constipation: Secondary | ICD-10-CM | POA: Diagnosis not present

## 2023-10-09 DIAGNOSIS — R110 Nausea: Secondary | ICD-10-CM | POA: Diagnosis not present

## 2023-10-09 DIAGNOSIS — Z1231 Encounter for screening mammogram for malignant neoplasm of breast: Secondary | ICD-10-CM | POA: Diagnosis not present

## 2023-10-30 DIAGNOSIS — K5909 Other constipation: Secondary | ICD-10-CM | POA: Diagnosis not present

## 2023-10-30 DIAGNOSIS — R600 Localized edema: Secondary | ICD-10-CM | POA: Diagnosis not present

## 2023-10-30 DIAGNOSIS — I10 Essential (primary) hypertension: Secondary | ICD-10-CM | POA: Diagnosis not present

## 2023-10-30 DIAGNOSIS — Z6834 Body mass index (BMI) 34.0-34.9, adult: Secondary | ICD-10-CM | POA: Diagnosis not present

## 2023-10-30 DIAGNOSIS — N390 Urinary tract infection, site not specified: Secondary | ICD-10-CM | POA: Diagnosis not present

## 2023-10-30 DIAGNOSIS — K219 Gastro-esophageal reflux disease without esophagitis: Secondary | ICD-10-CM | POA: Diagnosis not present

## 2023-10-30 DIAGNOSIS — E876 Hypokalemia: Secondary | ICD-10-CM | POA: Diagnosis not present

## 2023-10-30 DIAGNOSIS — I4891 Unspecified atrial fibrillation: Secondary | ICD-10-CM | POA: Diagnosis not present

## 2023-10-30 DIAGNOSIS — N1831 Chronic kidney disease, stage 3a: Secondary | ICD-10-CM | POA: Diagnosis not present

## 2023-10-30 DIAGNOSIS — D692 Other nonthrombocytopenic purpura: Secondary | ICD-10-CM | POA: Diagnosis not present

## 2023-11-14 DIAGNOSIS — K5909 Other constipation: Secondary | ICD-10-CM | POA: Diagnosis not present

## 2023-11-14 DIAGNOSIS — R600 Localized edema: Secondary | ICD-10-CM | POA: Diagnosis not present

## 2023-11-14 DIAGNOSIS — K219 Gastro-esophageal reflux disease without esophagitis: Secondary | ICD-10-CM | POA: Diagnosis not present

## 2023-11-14 DIAGNOSIS — I4891 Unspecified atrial fibrillation: Secondary | ICD-10-CM | POA: Diagnosis not present

## 2023-11-14 DIAGNOSIS — D692 Other nonthrombocytopenic purpura: Secondary | ICD-10-CM | POA: Diagnosis not present

## 2023-11-14 DIAGNOSIS — N1831 Chronic kidney disease, stage 3a: Secondary | ICD-10-CM | POA: Diagnosis not present

## 2023-11-14 DIAGNOSIS — I10 Essential (primary) hypertension: Secondary | ICD-10-CM | POA: Diagnosis not present

## 2023-11-14 DIAGNOSIS — N3281 Overactive bladder: Secondary | ICD-10-CM | POA: Diagnosis not present

## 2023-11-14 DIAGNOSIS — E876 Hypokalemia: Secondary | ICD-10-CM | POA: Diagnosis not present

## 2023-11-14 DIAGNOSIS — Z6834 Body mass index (BMI) 34.0-34.9, adult: Secondary | ICD-10-CM | POA: Diagnosis not present

## 2023-11-14 DIAGNOSIS — N390 Urinary tract infection, site not specified: Secondary | ICD-10-CM | POA: Diagnosis not present

## 2023-12-05 DIAGNOSIS — Z1231 Encounter for screening mammogram for malignant neoplasm of breast: Secondary | ICD-10-CM | POA: Diagnosis not present

## 2024-01-09 DIAGNOSIS — I4891 Unspecified atrial fibrillation: Secondary | ICD-10-CM | POA: Diagnosis not present

## 2024-01-09 DIAGNOSIS — K649 Unspecified hemorrhoids: Secondary | ICD-10-CM | POA: Diagnosis not present

## 2024-01-09 DIAGNOSIS — R600 Localized edema: Secondary | ICD-10-CM | POA: Diagnosis not present

## 2024-01-09 DIAGNOSIS — D692 Other nonthrombocytopenic purpura: Secondary | ICD-10-CM | POA: Diagnosis not present

## 2024-01-09 DIAGNOSIS — K5909 Other constipation: Secondary | ICD-10-CM | POA: Diagnosis not present

## 2024-01-09 DIAGNOSIS — R300 Dysuria: Secondary | ICD-10-CM | POA: Diagnosis not present

## 2024-01-09 DIAGNOSIS — K219 Gastro-esophageal reflux disease without esophagitis: Secondary | ICD-10-CM | POA: Diagnosis not present

## 2024-01-09 DIAGNOSIS — N1831 Chronic kidney disease, stage 3a: Secondary | ICD-10-CM | POA: Diagnosis not present

## 2024-01-09 DIAGNOSIS — N3281 Overactive bladder: Secondary | ICD-10-CM | POA: Diagnosis not present

## 2024-01-09 DIAGNOSIS — N390 Urinary tract infection, site not specified: Secondary | ICD-10-CM | POA: Diagnosis not present

## 2024-01-09 DIAGNOSIS — E876 Hypokalemia: Secondary | ICD-10-CM | POA: Diagnosis not present

## 2024-01-09 DIAGNOSIS — I10 Essential (primary) hypertension: Secondary | ICD-10-CM | POA: Diagnosis not present

## 2024-01-16 ENCOUNTER — Encounter: Payer: Self-pay | Admitting: Cardiology

## 2024-01-16 ENCOUNTER — Ambulatory Visit: Attending: Cardiology | Admitting: Cardiology

## 2024-01-16 VITALS — BP 120/80 | HR 57 | Ht 65.5 in | Wt 212.0 lb

## 2024-01-16 DIAGNOSIS — I4719 Other supraventricular tachycardia: Secondary | ICD-10-CM | POA: Diagnosis not present

## 2024-01-16 DIAGNOSIS — I10 Essential (primary) hypertension: Secondary | ICD-10-CM | POA: Diagnosis not present

## 2024-01-16 DIAGNOSIS — E785 Hyperlipidemia, unspecified: Secondary | ICD-10-CM

## 2024-01-16 MED ORDER — APIXABAN 5 MG PO TABS: 5.0000 mg | ORAL_TABLET | Freq: Two times a day (BID) | ORAL | Status: AC

## 2024-01-16 NOTE — Patient Instructions (Signed)

## 2024-01-16 NOTE — Progress Notes (Signed)
 Cardiology Office Note:    Date:  01/16/2024   ID:  Latasha Williams, DOB Jun 17, 1940, MRN [id number]  PCP:  Jama Chow, MD  Cardiologist:  Lamar Fitch, MD    Referring MD: Jama Chow, MD   Chief Complaint  Patient presents with   Follow-up    History of Present Illness:    Latasha Williams is a 83 y.o. female past medical history significant for paroxysmal atrial fibrillation, essential hypertension, dyslipidemia refused to take any statin.  Comes today to my office for follow-up overall doing fine.  Denies of any chest pain tightness squeezing pressure burning chest overall doing well  Past Medical History:  Diagnosis Date   Arthritis    GERD (gastroesophageal reflux disease)    History of skin cancer    BASAL CELL   Hypertension    Restless leg syndrome    OCCASIONAL PROBLEM ONLY    Past Surgical History:  Procedure Laterality Date   BREAST CYST EXCISION     bilateral  benign   CHOLECYSTECTOMY  2002   DILATION AND CURETTAGE OF UTERUS  2013   TOTAL KNEE ARTHROPLASTY Right 05/21/2012   Procedure: TOTAL KNEE ARTHROPLASTY;  Surgeon: Dempsey LULLA Moan, MD;  Location: [facility];  Service: Orthopedics;  Laterality: Right;    Current Medications: Current Meds  Medication Sig   acetaminophen  (TYLENOL ) 325 MG tablet Take 650 mg by mouth every 6 (six) hours as needed for mild pain or moderate pain.   apixaban  (ELIQUIS ) 5 MG TABS tablet Take 1 tablet (5 mg total) by mouth 2 (two) times daily.   apixaban  (ELIQUIS ) 5 MG TABS tablet Take 1 tablet (5 mg total) by mouth 2 (two) times daily.   atenolol -chlorthalidone  (TENORETIC ) 50-25 MG per tablet Take 0.5 tablets by mouth daily before breakfast.    Multiple Vitamins-Minerals (B COMPLEX-C-E-ZINC) tablet Take 1 tablet by mouth daily. Unknown strenght   omeprazole  (PRILOSEC) 20 MG capsule Take 20 mg by mouth daily.   ondansetron  (ZOFRAN ) 4 MG tablet Take 1 tablet (4 mg total) by mouth every 6 (six) hours as needed for nausea.   potassium  chloride SA (KLOR-CON  M) 20 MEQ tablet TAKE TWO TABLETS BY MOUTH TWICE DAILY   pramipexole (MIRAPEX) 0.5 MG tablet Take 1 tablet by mouth at bedtime.   torsemide  (DEMADEX ) 20 MG tablet Take 1 tablet (20 mg total) by mouth every other day.   traMADol  (ULTRAM ) 50 MG tablet Take 50-100 mg by mouth every 6 (six) hours as needed for moderate pain or severe pain.   Vitamin D , Ergocalciferol , (DRISDOL ) 1.25 MG (50000 UNIT) CAPS capsule Take 1 capsule (50,000 Units total) by mouth every 7 (seven) days. Once weekly.     Allergies:   Patient has no known allergies.   Social History   Socioeconomic History   Marital status: Unknown    Spouse name: Not on file   Number of children: Not on file   Years of education: Not on file   Highest education level: Not on file  Occupational History   Not on file  Tobacco Use   Smoking status: Never   Smokeless tobacco: Never  Substance and Sexual Activity   Alcohol use: No   Drug use: No   Sexual activity: Not Currently  Other Topics Concern   Not on file  Social History Narrative   Not on file   Social Drivers of Health   Financial Resource Strain: Not on file  Food Insecurity: Not on file  Transportation  Needs: Not on file  Physical Activity: Not on file  Stress: Not on file  Social Connections: Not on file     Family History: The patient's family history includes Colon polyps in her sister; Congestive Heart Failure in her mother. ROS:   Please see the history of present illness.    All 14 point review of systems negative except as described per history of present illness  EKGs/Labs/Other Studies Reviewed:    EKG Interpretation Date/Time:  Tuesday January 16 2024 10:23:49 EST Ventricular Rate:  57 PR Interval:  166 QRS Duration:  94 QT Interval:  446 QTC Calculation: 434 R Axis:   -11  Text Interpretation: Sinus bradycardia When compared with ECG of 28-Apr-2023 10:44, Premature supraventricular complexes are no longer Present  Confirmed by Bernie Charleston [phone]) on 01/16/2024 10:28:03 AM    Recent Labs: No results found for requested labs within last 365 days.  Recent Lipid Panel No results found for: CHOL, TRIG, HDL, CHOLHDL, VLDL, LDLCALC, LDLDIRECT  Physical Exam:    VS:  BP 120/80   Pulse (!) 57   Ht 5' 5.5 (1.664 m)   Wt 212 lb (96.2 kg)   SpO2 99%   BMI 34.74 kg/m     Wt Readings from Last 3 Encounters:  01/16/24 212 lb (96.2 kg)  07/18/23 210 lb 3.2 oz (95.3 kg)  04/28/23 214 lb 6.4 oz (97.3 kg)     GEN:  Well nourished, well developed in no acute distress HEENT: Normal NECK: No JVD; No carotid bruits LYMPHATICS: No lymphadenopathy CARDIAC: RRR, no murmurs, no rubs, no gallops RESPIRATORY:  Clear to auscultation without rales, wheezing or rhonchi  ABDOMEN: Soft, non-tender, non-distended MUSCULOSKELETAL:  No edema; No deformity  SKIN: Warm and dry LOWER EXTREMITIES: no swelling NEUROLOGIC:  Alert and oriented x 3 PSYCHIATRIC:  Normal affect   ASSESSMENT:    1. PAT (paroxysmal atrial tachycardia)   2. Primary hypertension   3. Dyslipidemia    PLAN:    In order of problems listed above:  Paroxysmal atrial fibrillation maintained sinus rhythm, takes Eliquis  continue this management dose is appropriate to her weight age and kidney function. Dyslipidemia, still refused to take any cholesterol medication likely does not have any signs and symptoms of   Medication Adjustments/Labs and Tests Ordered: Current medicines are reviewed at length with the patient today.  Concerns regarding medicines are outlined above.  Orders Placed This Encounter  Procedures   EKG 12-Lead   Medication changes:  Meds ordered this encounter  Medications   apixaban  (ELIQUIS ) 5 MG TABS tablet    Sig: Take 1 tablet (5 mg total) by mouth 2 (two) times daily.    Lot Number?:   [id number]    Expiration Date?:   08/13/2025    Signed, [place] DOROTHA Bernie, MD, [facility] 01/16/2024 10:46 AM     [facility]
